# Patient Record
Sex: Female | Born: 1935 | Race: White | Hispanic: No | Smoking: Former smoker
Health system: Southern US, Community
[De-identification: ages and names within clinical notes are randomized; demographics above are authoritative.]

## PROBLEM LIST (undated history)

## (undated) DIAGNOSIS — H409 Unspecified glaucoma: Secondary | ICD-10-CM

## (undated) DIAGNOSIS — Z0000 Encounter for general adult medical examination without abnormal findings: Secondary | ICD-10-CM

## (undated) HISTORY — PX: APPENDECTOMY: SHX54

## (undated) HISTORY — PX: EYE SURGERY: SHX253

---

## 2014-03-03 LAB — POCT URINALYSIS DIPSTICK W/O MICROSCOPE (AUTO)
Bilirubin, UA: NEGATIVE
Blood, UA POC: 25
Glucose, UA POC: NEGATIVE
Ketones, UA: NEGATIVE
Leukocytes, UA: 500
Nitrite, UA: NEGATIVE
Protein, UA POC: NEGATIVE
Spec Grav, UA: 1.015
Urobilinogen, UA: 0.2
pH, UA: 6.5

## 2014-03-03 MED ORDER — NITROFURANTOIN MONOHYD MACRO 100 MG PO CAPS
100 | ORAL_CAPSULE | Freq: Two times a day (BID) | ORAL | Status: AC
Start: 2014-03-03 — End: 2014-03-10

## 2014-03-03 NOTE — Progress Notes (Signed)
Subjective:      Patient ID: Judith RosebushBarbara Liu is a 80 y.o. female.    HPI  Here for possible UTI  Sxs for 3-4 days  + freq  + urg  + dysuria  no hematuria  No vaginal complaints: itch, discharge, bleeding    No abd pain  No back/flank pain  No fever  No chills   No rash  Nl appetite  + h/o recent diarrhea    + h/o UTI - chronic, frequent - treated with Cipro recently for positive urine culture but patient sts she didn't have symptoms at that time - finished antibiotic Thursday and sxs began Sturday  Non-DM      Review of Systems   Constitutional: Negative for fever, chills, diaphoresis and fatigue.   Gastrointestinal: Negative for nausea, vomiting, abdominal pain and diarrhea.   Endocrine:        Non-diabetic   Genitourinary: Positive for dysuria, urgency and frequency. Negative for hematuria, flank pain, vaginal bleeding, vaginal discharge, vaginal pain and pelvic pain.   Musculoskeletal: Negative for back pain.   Skin: Negative for rash.   Neurological: Negative for dizziness.         Objective:   Physical Exam   Constitutional: Vital signs are normal. She appears well-developed and well-nourished.  Non-toxic appearance. She does not appear ill.   Eyes: Conjunctivae are normal. No scleral icterus.   Cardiovascular: Normal rate, regular rhythm and normal heart sounds.    Pulmonary/Chest: Effort normal and breath sounds normal. She has no wheezes. She has no rhonchi. She has no rales.   Abdominal: Soft. Normal appearance and bowel sounds are normal. She exhibits no distension and no mass. There is no hepatosplenomegaly. There is no tenderness. There is no rigidity, no rebound, no guarding and no CVA tenderness.   Genitourinary:   Exam deferred   Neurological: She is alert.   Nursing note and vitals reviewed.    Urine dip: + leuks      Assessment:      1. Urinary tract infection  nitrofurantoin, macrocrystal-monohydrate, (MACROBID) 100 MG capsule   2. Urinary frequency  POCT Urinalysis No Micro (Auto)    Urine Culture             Plan:       Fluids  As per orders         Jamine Wingate T Elizaveta Mattice        Follow up with PCP in 3-5 days. Call for appointment with your current PCP or please set up to establish with new PCP ASAP for appropriate follow up.

## 2014-03-05 LAB — CULTURE, URINE

## 2014-05-11 ENCOUNTER — Ambulatory Visit: Admit: 2014-05-11 | Discharge: 2014-05-11 | Payer: BLUE CROSS/BLUE SHIELD | Attending: Internal Medicine

## 2014-05-11 DIAGNOSIS — Z0000 Encounter for general adult medical examination without abnormal findings: Secondary | ICD-10-CM

## 2014-05-11 MED ORDER — COMPRESSION STOCKINGS
Status: DC
Start: 2014-05-11 — End: 2016-05-16

## 2014-05-11 NOTE — Progress Notes (Signed)
Subjective:      Patient ID: Judith RosebushBarbara Liu is a 80 y.o. female.    HPI     Pt here to establish care.  She tells me her old PCP retired so she needed a new one.  She is overall very healthy.  She only has issues with glaucoma.  She sees Dr. Letta KocherWillet for this.  She takes her eye drops as directed.  She also is on statin for hyperlipidemia.      She does have some lower extremity edema.  She would like an rx for compression stockings.      She does have a history of UTI's especially when traveling.  She is not sure why this occurs.      She does have some issues with sleep.  She tells me she will have to use temazepam for this but uses it as needed.  She also admits to not being able to have a cocktail if she takes it.      Pt already got her flu shot through her pharmacy.      She no longer is having mammograms done.      Review of Systems   Constitutional: Negative for fever, chills, activity change, appetite change, fatigue and unexpected weight change.   HENT: Negative for congestion, postnasal drip, sinus pressure, sore throat and trouble swallowing.    Eyes: Negative for visual disturbance.        Glaucoma    Respiratory: Negative for apnea, cough, chest tightness, shortness of breath, wheezing and stridor.    Cardiovascular: Positive for leg swelling. Negative for chest pain and palpitations.   Gastrointestinal: Negative for nausea, vomiting, abdominal pain, diarrhea, constipation, blood in stool, abdominal distention and rectal pain.   Endocrine: Negative for polydipsia and polyuria.   Genitourinary: Negative for dysuria, urgency, frequency and difficulty urinating.   Musculoskeletal: Negative for myalgias, back pain, arthralgias and neck pain.   Skin: Negative for color change and rash.   Allergic/Immunologic: Negative for environmental allergies and food allergies.   Neurological: Negative for dizziness, tremors, seizures, syncope, weakness, light-headedness and headaches.   Hematological: Negative for  adenopathy. Does not bruise/bleed easily.   Psychiatric/Behavioral: Positive for sleep disturbance. Negative for dysphoric mood and agitation. The patient is not nervous/anxious.        Objective:   Physical Exam   Constitutional: She is oriented to person, place, and time. She appears well-developed and well-nourished.   HENT:   Head: Atraumatic.   Right Ear: External ear normal.   Left Ear: External ear normal.   Nose: Nose normal.   Mouth/Throat: Oropharynx is clear and moist.   Eyes: EOM are normal.   Right eye-pupil is disfigured-doesn't react to light   Neck: Neck supple. No thyromegaly present.   Cardiovascular: Normal rate, regular rhythm, normal heart sounds and intact distal pulses.    No murmur heard.  Pulmonary/Chest: Effort normal and breath sounds normal. No respiratory distress. She has no wheezes. She has no rales. She exhibits no tenderness.   Abdominal: Soft. Bowel sounds are normal. She exhibits no distension and no mass. There is no tenderness. There is no rebound and no guarding.   Musculoskeletal: Normal range of motion. She exhibits no edema or tenderness.   Lymphadenopathy:     She has no cervical adenopathy.   Neurological: She is alert and oriented to person, place, and time.   Skin: Skin is warm and dry.   Psychiatric: She has a normal mood and affect.  Assessment:      1. Routine adult health maintenance  Comprehensive Metabolic Panel    CBC   2. Glaucoma     3. Hyperlipemia  Comprehensive Metabolic Panel    Lipid Panel   4. Frequent UTI  CBC    URINALYSIS-MACROSCOPIC   5. Bilateral edema of lower extremity  TSH without Reflex    Compression Stockings MISC            Plan:      1) Will order labs for patient to get done in next 1-2 weeks  2) Ordered compression stockings for leg swelling  3) F/u with Dr. Letta KocherWillet for her glaucoma  4) F/u in 1 year

## 2014-06-10 ENCOUNTER — Encounter: Admit: 2014-06-10 | Discharge: 2014-06-10 | Payer: BLUE CROSS/BLUE SHIELD

## 2014-06-10 DIAGNOSIS — R600 Localized edema: Secondary | ICD-10-CM

## 2014-06-10 LAB — COMPREHENSIVE METABOLIC PANEL
ALT: 58 U/L (ref 13–61)
AST: 39 U/L — ABNORMAL HIGH (ref 0–31)
Albumin,Serum: 3.8 G/dL (ref 3.4–5.0)
Albumin/Globulin Ratio: 1.1 RATIO (ref 1.0–2.5)
Alkaline Phosphatase: 64 U/L (ref 45–117)
Anion Gap: 10 mmol/L (ref 6–16)
BUN/Creatinine Ratio: 17 RATIO (ref 6.0–20.0)
BUN: 16 mG/dL (ref 7–25)
CO2: 25 mmol/L (ref 21–31)
Calcium: 9.3 mG/dL (ref 8.2–10.5)
Chloride: 107 mmol/L (ref 98–109)
Creatinine: 0.94 mG/dL (ref 0.60–1.50)
GFR African American: >60 mL/min/{1.73_m2}
GFR Non-African American: 56 mL/min/{1.73_m2} — AB
Globulin: 3.4 G/dL (ref 2.4–4.1)
Glucose: 118 mG/dL — ABNORMAL HIGH (ref 70–100)
Potassium: 3.5 mmol/L (ref 3.5–5.0)
Sodium: 142 mmol/L (ref 135–145)
Total Bilirubin: 0.8 mG/dL (ref 0.3–1.0)
Total Protein: 7.2 G/dL (ref 6.4–8.3)

## 2014-06-10 LAB — LIPID PANEL
Cholesterol, Total: 223 mG/dL — ABNORMAL HIGH (ref 100–199)
HDL: 77 mG/dL (ref 40–59)
LDL Calculated: 124 mG/dL (ref 0–129)
Triglycerides: 111 mG/dL (ref 40–149)

## 2014-06-11 LAB — URINALYSIS-MACROSCOPIC
Bilirubin Urine: NEGATIVE
Erythrocytes, Urine: 5 /HPF — ABNORMAL HIGH (ref 0–3)
Glucose, Ur: NEGATIVE
Ketones, Urine: NEGATIVE
LEUKOCYTES, UA: >100 /HPF — ABNORMAL HIGH (ref 0–5)
Nitrite, Urine: NEGATIVE
Protein, UA: NEGATIVE
Specific Gravity, UA: 1.013 (ref 1.005–1.030)
Urobilinogen, Urine: 1 mG/dL (ref 0.2–1.0)
pH, UA: 6 (ref 5.0–8.0)

## 2014-06-17 ENCOUNTER — Ambulatory Visit: Admit: 2014-06-17 | Discharge: 2014-06-17 | Payer: BLUE CROSS/BLUE SHIELD | Attending: Family Medicine

## 2014-06-17 DIAGNOSIS — N3001 Acute cystitis with hematuria: Secondary | ICD-10-CM

## 2014-06-17 LAB — POCT URINALYSIS DIPSTICK W/O MICROSCOPE (AUTO)
Bilirubin, UA: NEGATIVE
Blood, UA POC: 10
Glucose, UA POC: NEGATIVE
Ketones, UA: NEGATIVE
Leukocytes, UA: 500
Nitrite, UA: NEGATIVE
Protein, UA POC: 15
Spec Grav, UA: 1.025
Urobilinogen, UA: 0.2
pH, UA: 6

## 2014-06-17 MED ORDER — NITROFURANTOIN MONOHYD MACRO 100 MG PO CAPS
100 MG | ORAL_CAPSULE | Freq: Two times a day (BID) | ORAL | Status: DC
Start: 2014-06-17 — End: 2014-06-17

## 2014-06-17 MED ORDER — CIPROFLOXACIN HCL 500 MG PO TABS
500 MG | ORAL_TABLET | Freq: Two times a day (BID) | ORAL | Status: AC
Start: 2014-06-17 — End: 2014-06-22

## 2014-06-17 NOTE — Addendum Note (Signed)
Addended byCorinna Lines: Zinia Innocent on: 06/17/2014 12:27 PM     Modules accepted: Orders, Medications

## 2014-06-17 NOTE — Progress Notes (Signed)
macrobid requires prior auth per pharmacy - Rx changed to Cipro, 500 mg BID x 5 days

## 2014-06-17 NOTE — Progress Notes (Signed)
Subjective:      Patient ID: Judith Liu is a 80 y.o. female.    HPI  Here for possible UTI  Sxs for 2 days  + freq  + urg  + dysuria  no hematuria  No vaginal complaints: itch, discharge, bleeding    No abd pain  No n/vtg/diarrhea  No back/flank pain  No fever  No chills   No rash  Nl appetite    + h/o UTI - chronic, frequent  Seen here in August - treated to resolution with Macrobid (Cipro for some reason was not effective)  Non-DM      Review of Systems   Constitutional: Negative for fever, chills, diaphoresis and fatigue.   Gastrointestinal: Negative for nausea, vomiting, abdominal pain and diarrhea.   Endocrine:        Non-diabetic   Genitourinary: Positive for dysuria, urgency and frequency. Negative for hematuria, flank pain, vaginal bleeding, vaginal discharge, vaginal pain and pelvic pain.   Musculoskeletal: Negative for back pain.   Skin: Negative for rash.   Neurological: Negative for dizziness.       Objective:   Physical Exam   Constitutional: Vital signs are normal. She appears well-developed and well-nourished.  Non-toxic appearance. She does not appear ill.   Eyes: Conjunctivae are normal. No scleral icterus.   Cardiovascular: Normal rate, regular rhythm and normal heart sounds.    Pulmonary/Chest: Effort normal and breath sounds normal. She has no wheezes. She has no rhonchi. She has no rales.   Abdominal: Soft. Normal appearance and bowel sounds are normal. She exhibits no distension and no mass. There is no hepatosplenomegaly. There is no tenderness. There is no rigidity, no rebound, no guarding and no CVA tenderness.   Genitourinary:   Exam deferred   Neurological: She is alert.   Nursing note and vitals reviewed.      Urine dip: + tr blood, + leuks        Assessment:      1. Acute cystitis with hematuria  Urine Culture    nitrofurantoin, macrocrystal-monohydrate, (MACROBID) 100 MG capsule   2. Dysuria  POCT Urinalysis No Micro (Auto)            Plan:      As per orders  Fluids  Azo prn          Marchello Rothgeb T Vonzell Lindblad        Follow up with PCP in 3-5 days. Call for appointment with your current PCP or please set up to establish with new PCP ASAP for appropriate follow up.

## 2014-06-19 LAB — CULTURE, URINE

## 2014-08-18 ENCOUNTER — Ambulatory Visit: Admit: 2014-08-18 | Discharge: 2014-08-18 | Payer: BLUE CROSS/BLUE SHIELD | Attending: Family Medicine

## 2014-08-18 DIAGNOSIS — N390 Urinary tract infection, site not specified: Secondary | ICD-10-CM

## 2014-08-18 LAB — POCT URINALYSIS DIPSTICK W/O MICROSCOPE (AUTO)
Bilirubin, UA: NEGATIVE
Blood, UA POC: 80
Glucose, UA POC: NEGATIVE
Ketones, UA: NEGATIVE
Leukocytes, UA: 500
Nitrite, UA: POSITIVE
Protein, UA POC: 30
Spec Grav, UA: 1.02
Urobilinogen, UA: 1
pH, UA: 6.5

## 2014-08-18 MED ORDER — NITROFURANTOIN MONOHYD MACRO 100 MG PO CAPS
100 MG | ORAL_CAPSULE | Freq: Two times a day (BID) | ORAL | Status: AC
Start: 2014-08-18 — End: 2014-08-25

## 2014-08-18 NOTE — Progress Notes (Signed)
[facility]  [facility]  [address]  [place] [address]  Dept: [phone]  Dept Fax: [phone]  Loc: [phone]    Judith Liu is a 81 y.o. female who presents today for her medical conditions/complaints as noted below.  Marykate Heuberger is c/o of Dysuria; and Other      Chief Complaint   Patient presents with   ??? Dysuria     pain started 1 day ago   ??? Other     est         HPI:     Dysuria   This is a new problem. The current episode started in the past 7 days. The problem has been gradually worsening. The pain is mild. Associated symptoms include frequency and urgency. Pertinent negatives include no chills, discharge, hematuria, hesitancy, nausea, sweats or vomiting. She has tried increased fluids for the symptoms. The treatment provided no relief. Her past medical history is significant for recurrent UTIs.   Other  Pertinent negatives include no chills, nausea or vomiting.       Past Medical History   Diagnosis Date   ??? Glaucoma    ??? Hyperlipidemia       Past Surgical History   Procedure Laterality Date   ??? Appendectomy     ??? Hysterectomy         Family History   Problem Relation Age of Onset   ??? Mental Illness Sister      possible bipolar   ??? Asthma Daughter    ??? Asthma Son        History   Substance Use Topics   ??? Smoking status: Former Smoker     Types: Cigarettes   ??? Smokeless tobacco: Never Used   ??? Alcohol Use: 0.6 oz/week     1 Not specified per week      Comment: 1 martini at night       Current Outpatient Prescriptions   Medication Sig Dispense Refill   ??? nitrofurantoin, macrocrystal-monohydrate, (MACROBID) 100 MG capsule Take 1 capsule by mouth 2 times daily for 7 days 14 capsule 0   ??? simvastatin (ZOCOR) 10 MG tablet   1   ??? brimonidine (ALPHAGAN P) 0.15 % ophthalmic solution   12   ??? FLUZONE HIGH-DOSE 0.5 ML SUSY injection   0   ??? diclofenac (VOLTAREN) 0.1 % ophthalmic solution 1 drop 4 times daily     ??? dorzolamide-timolol (COSOPT) 22.3-6.8 MG/ML ophthalmic  solution Use 1 Drop in the right eye twice daily.     ??? brimonidine (ALPHAGAN) 0.2 % ophthalmic solution 1 drop 3 times daily     ??? aspirin 81 MG tablet Take 81 mg by mouth daily     ??? Omega-3 Fatty Acids (OMEGA 3 PO) Take by mouth     ??? Multiple Vitamins-Minerals (CENTRUM SILVER PO) Take by mouth     ??? Compression Stockings MISC by Does not apply route 20/5mm HG, wear during the day and take off at night  Knee high 1 each 0   ??? bimatoprost (LUMIGAN) 0.01 % SOLN ophthalmic drops Place 1 drop into the right eye nightly       No current facility-administered medications for this visit.     Allergies   Allergen Reactions   ??? Sulfa Antibiotics Other (See Comments)       Health Maintenance   Topic Date Due   ??? TETANUS VACCINE ADULT (11 YEARS AND UP)  04/04/1937   ???  ZOSTAVAX VACCINE  04/03/1988   ??? PNEUMO VAC >= 65 (1 of 2 - PCV13) 04/03/1993   ??? DEPRESSION SCREENING  04/03/1993   ??? FALLS RISK  04/03/1993   ??? FLU VACCINE YEARLY (ADULT)  03/01/2015       Subjective:      Review of Systems   Constitutional: Negative for chills.   Gastrointestinal: Negative for nausea and vomiting.   Genitourinary: Positive for dysuria, urgency and frequency. Negative for hesitancy and hematuria.       Objective:     Physical Exam   Constitutional: She appears well-developed and well-nourished.   HENT:   Head: Normocephalic.   Eyes: Pupils are equal, round, and reactive to light.   Neck: Normal range of motion.   Cardiovascular: Normal rate.    No murmur heard.  Pulmonary/Chest: Effort normal. She has no wheezes.   Abdominal: Soft. She exhibits no distension. There is no tenderness. There is no rebound.   Musculoskeletal: She exhibits no tenderness.   Lymphadenopathy:     She has no cervical adenopathy.   Neurological: She is alert.   Skin: Skin is warm. No rash noted. No erythema.     BP 117/77 mmHg   Pulse 97   Temp(Src) 98.3 ??F (36.8 ??C) (Temporal)   Wt 141 lb (63.957 kg)    Assessment:      The primary encounter diagnosis was Urinary  tract infection without hematuria, site unspecified. Diagnoses of Dysuria and Leukocytes in urine were also pertinent to this visit.    Plan:     Orders Placed This Encounter   Medications   ??? nitrofurantoin, macrocrystal-monohydrate, (MACROBID) 100 MG capsule     Sig: Take 1 capsule by mouth 2 times daily for 7 days     Dispense:  14 capsule     Refill:  0       Orders Placed This Encounter   Procedures   ??? Urine Culture     Standing Status: Future      Number of Occurrences: 1      Standing Expiration Date: 08/19/2015     Order Specific Question:  Specify (ex-cath, midstream, cysto, etc)?     Answer:  midstream   ??? POCT Urinalysis No Micro (Auto)     Follow up with PCP in 3-5 days. Call for appointment with your current PCP or please set up to establish with new PCP ASAP for appropriate follow up.  Humidify Air at home, Take medications as directed, Increase fluids and alternate Tylenol and Advil every four hours to help control pain and fever, Instructions reviewed with patient and all questions answered prior to discharge  Call for culture results    Patient given educational materials - see patient instructions.  Discussed use, benefit, and side effects of prescribed medications.  All patient questions answered.  Pt voiced understanding. Patient advised to follow up with primary care physician for all health maintenance concerns and follow up as appropriate.  Instructed to continue current medications, diet and exercise.  Patient agreed with treatment plan. Follow up as directed.     Electronically signed by Juluis Rainierory A Mohit Zirbes, DO on 08/18/2014 at 1:11 PM

## 2014-08-18 NOTE — Patient Instructions (Signed)
Follow up with PCP in 3-5 days. Call for appointment with your current PCP or please set up to establish with new PCP ASAP for appropriate follow up.  Humidify Air at home, Take medications as directed, Increase fluids and alternate Tylenol and Advil every four hours to help control pain and fever, Instructions reviewed with patient and all questions answered prior to discharge

## 2014-08-20 LAB — CULTURE, URINE

## 2015-05-11 ENCOUNTER — Encounter: Attending: Internal Medicine

## 2015-08-03 ENCOUNTER — Ambulatory Visit: Admit: 2015-08-03 | Discharge: 2015-08-03 | Payer: MEDICARE | Attending: Family Medicine

## 2015-08-03 DIAGNOSIS — J069 Acute upper respiratory infection, unspecified: Secondary | ICD-10-CM

## 2015-08-03 MED ORDER — PSEUDOEPH-BROMPHEN-DM 30-2-10 MG/5ML PO SYRP
2-30-10 MG/5ML | Freq: Four times a day (QID) | ORAL | 0 refills | Status: DC | PRN
Start: 2015-08-03 — End: 2016-05-16

## 2015-08-03 NOTE — Progress Notes (Signed)
[facility]  [facility]  [address]  [place] [id number]  Dept: [phone]  Dept Fax: [phone]  Loc: [phone]    Judith RosebushBarbara Liu is a 82 y.o. female who presents today for her medical conditions/complaints as noted below.  Judith Liu is c/o of Pharyngitis (x1 day, drainage) and Other (est)      Chief Complaint   Patient presents with   ??? Pharyngitis     x1 day, drainage   ??? Other     est         HPI:     HPI   Pt. Presented here c/o sore throat this morning when she wakes up; improved as the day progress. No fever or chills. No other complaints.    Past Medical History   Diagnosis Date   ??? Glaucoma    ??? Hyperlipidemia       Past Surgical History   Procedure Laterality Date   ??? Appendectomy     ??? Hysterectomy         Family History   Problem Relation Age of Onset   ??? Mental Illness Sister      possible bipolar   ??? Asthma Daughter    ??? Asthma Son        Social History   Substance Use Topics   ??? Smoking status: Former Smoker     Types: Cigarettes   ??? Smokeless tobacco: Never Used   ??? Alcohol use 0.6 oz/week     1 Standard drinks or equivalent per week      Comment: 1 martini at night       Current Outpatient Prescriptions   Medication Sig Dispense Refill   ??? brimonidine (ALPHAGAN P) 0.15 % ophthalmic solution   12   ??? FLUZONE HIGH-DOSE 0.5 ML SUSY injection   0   ??? diclofenac (VOLTAREN) 0.1 % ophthalmic solution 1 drop 4 times daily     ??? dorzolamide-timolol (COSOPT) 22.3-6.8 MG/ML ophthalmic solution Use 1 Drop in the right eye twice daily.     ??? brimonidine (ALPHAGAN) 0.2 % ophthalmic solution 1 drop 3 times daily     ??? aspirin 81 MG tablet Take 81 mg by mouth daily     ??? Omega-3 Fatty Acids (OMEGA 3 PO) Take by mouth     ??? Multiple Vitamins-Minerals (CENTRUM SILVER PO) Take by mouth     ??? Compression Stockings MISC by Does not apply route 20/7030mm HG, wear during the day and take off at night  Knee high 1 each 0   ??? bimatoprost (LUMIGAN) 0.01 % SOLN ophthalmic drops Place  1 drop into the right eye nightly       No current facility-administered medications for this visit.      Allergies   Allergen Reactions   ??? Sulfa Antibiotics Other (See Comments)       Health Maintenance   Topic Date Due   ??? DTaP/Tdap/Td vaccine (1 - Tdap) 04/04/1945   ??? Zostavax vaccine  04/03/1988   ??? Pneumococcal low/med risk (1 of 2 - PCV13) 04/03/1993   ??? Flu vaccine (1) 03/01/2015       Subjective:      Review of Systems   HENT: Positive for sore throat.    All other systems reviewed and are negative.      Objective:     Physical Exam   Constitutional: She appears well-developed.   HENT:   Head: Normocephalic and atraumatic.  Right Ear: External ear normal.   Left Ear: External ear normal.   Nose: Nose normal.   Mouth/Throat: Oropharynx is clear and moist.   Eyes: Pupils are equal, round, and reactive to light.   Neck: Normal range of motion.   Cardiovascular: Normal rate.    Pulmonary/Chest: Effort normal.   Abdominal: Soft.     Visit Vitals   ??? BP (!) 158/69   ??? Pulse 76   ??? Temp 97.9 ??F (36.6 ??C) (Temporal)   ??? Wt 137 lb (62.1 kg)   ??? BMI 23.52 kg/m2       Assessment:      Viral URI    Plan:     Patient given educational materials - see patient instructions.  Discussed use, benefit, and side effects of prescribed medications.  All patient questions answered.  Pt voiced understanding. Patient advised to follow up with primary care physician for all health maintenance concerns and follow up as appropriate.  Instructed to continue current medications, diet and exercise.  Patient agreed with treatment plan. Follow up as directed.     Electronically signed by Floydene Flock, MD on 08/03/2015 at 12:32 PM

## 2016-05-16 ENCOUNTER — Encounter

## 2016-05-16 ENCOUNTER — Ambulatory Visit: Admit: 2016-05-16 | Discharge: 2016-05-16 | Payer: MEDICARE | Attending: Family Medicine

## 2016-05-16 DIAGNOSIS — Z0000 Encounter for general adult medical examination without abnormal findings: Secondary | ICD-10-CM

## 2016-05-16 LAB — CBC WITH AUTO DIFFERENTIAL
Absolute Baso #: 0.1 10*3/uL (ref 0.0–0.2)
Absolute Eos #: 0.1 10*3/uL (ref 0.0–0.5)
Absolute Lymph #: 2.5 10*3/uL (ref 1.0–4.3)
Absolute Mono #: 0.8 10*3/uL (ref 0.0–0.8)
Absolute Neut #: 5 10*3/uL (ref 1.8–7.0)
Basophils: 0.8 %
Eosinophils: 1.5 %
Granulocytes %: 58.9 %
Hematocrit: 46 % (ref 35.0–47.0)
Hemoglobin: 15.4 g/dL (ref 11.7–16.0)
Lymphocyte %: 29.1 %
MCH: 34.1 pg — ABNORMAL HIGH (ref 26.0–34.0)
MCHC: 33.6 % (ref 32.0–36.0)
MCV: 101.5 fL — ABNORMAL HIGH (ref 79.0–98.0)
MPV: 8.9 fL (ref 7.4–10.4)
Monocytes: 9.7 %
Platelets: 204 10*3/uL (ref 140–440)
RBC: 4.53 10*6/uL (ref 3.80–5.20)
RDW: 14.1 % (ref 11.5–14.5)
WBC: 8.4 10*3/uL (ref 3.6–10.7)

## 2016-05-16 NOTE — Progress Notes (Signed)
Subjective:     Patient: Judith RosebushBarbara Liu is a 82 y.o. female     HPI Pt presents to the office to establish care. States that she has no compliants. States that her children are trying to transition her to a nursing home, she is not so sure that she would like to do this, likes being independent. States that she has had a mammogram in the past year, states that she does not remember where it was performed. States that she has already had flu shot. Has glaucoma that she sees Dr Beckey DowningWillett. Denies any other medications and is not interested in labs at this time.  Okay with pneumonia vaccination    Review of Systems   Constitutional: Negative for activity change, appetite change, chills, diaphoresis, fatigue, fever and unexpected weight change.   HENT: Negative for congestion, ear pain, postnasal drip, rhinorrhea and sore throat.    Eyes: Negative for pain and visual disturbance.   Respiratory: Negative for cough, shortness of breath, wheezing and stridor.    Cardiovascular: Negative for chest pain, palpitations and leg swelling.   Gastrointestinal: Negative for abdominal pain, blood in stool, constipation, diarrhea, nausea and vomiting.   Endocrine: Negative for polydipsia, polyphagia and polyuria.   Genitourinary: Negative for dysuria, frequency, hematuria, menstrual problem, urgency and vaginal pain.   Musculoskeletal: Negative for arthralgias, back pain and myalgias.   Skin: Negative for color change and rash.   Neurological: Negative for dizziness, weakness, numbness and headaches.   Hematological: Does not bruise/bleed easily.   Psychiatric/Behavioral: Negative for dysphoric mood. The patient is not nervous/anxious.         Allergies   Allergen Reactions   ??? Sulfa Antibiotics Other (See Comments)     Current Outpatient Prescriptions on File Prior to Visit   Medication Sig Dispense Refill   ??? dorzolamide-timolol (COSOPT) 22.3-6.8 MG/ML ophthalmic solution Use 1 Drop in the right eye twice daily.     ??? brimonidine  (ALPHAGAN) 0.2 % ophthalmic solution 1 drop 3 times daily     ??? aspirin 81 MG tablet Take 81 mg by mouth daily     ??? Omega-3 Fatty Acids (OMEGA 3 PO) Take by mouth     ??? Multiple Vitamins-Minerals (CENTRUM SILVER PO) Take by mouth     ??? bimatoprost (LUMIGAN) 0.01 % SOLN ophthalmic drops Place 1 drop into the right eye nightly       No current facility-administered medications on file prior to visit.       Past Medical History:   Diagnosis Date   ??? Glaucoma    ??? Hyperlipidemia       Past Surgical History:   Procedure Laterality Date   ??? APPENDECTOMY     ??? HYSTERECTOMY       Family History   Problem Relation Age of Onset   ??? Mental Illness Sister      possible bipolar   ??? Asthma Daughter    ??? Asthma Son      Social History   Substance Use Topics   ??? Smoking status: Former Smoker     Types: Cigarettes   ??? Smokeless tobacco: Never Used   ??? Alcohol use 0.6 oz/week     1 Standard drinks or equivalent per week      Comment: 1 martini at night           Objective:     BP 110/64    Pulse 115    Temp 98.2 ??F (36.8 ??C)  Ht 5\' 4"  (1.626 m)    Wt 123 lb 9.6 oz (56.1 kg)    SpO2 96%    BMI 21.22 kg/m??     Physical Exam   Constitutional: She is oriented to person, place, and time. She appears well-developed and well-nourished.   HENT:   Head: Normocephalic.   Right Ear: Tympanic membrane and ear canal normal.   Left Ear: Tympanic membrane and ear canal normal.   Mouth/Throat: Oropharynx is clear and moist and mucous membranes are normal.   Eyes: Conjunctivae and EOM are normal. Pupils are equal, round, and reactive to light.   Neck: Normal range of motion. Neck supple. No thyromegaly present.   Cardiovascular: Normal rate, regular rhythm and normal heart sounds.    Pulmonary/Chest: Effort normal and breath sounds normal. No respiratory distress. She has no wheezes. She has no rales.   Abdominal: Soft. Bowel sounds are normal. She exhibits no distension and no mass. There is no tenderness. There is no rebound and no guarding.    Musculoskeletal: Normal range of motion. She exhibits no edema or tenderness.   Lymphadenopathy:     She has no cervical adenopathy.   Neurological: She is alert and oriented to person, place, and time. She has normal reflexes. No cranial nerve deficit.   Skin: Skin is warm and dry. No rash noted.   Psychiatric: She has a normal mood and affect. Judgment normal.   Nursing note and vitals reviewed.      Assessment      1. Annual physical exam         Plan      1. Annual physical exam  - Comprehensive Metabolic Panel; Future  - TSH without Reflex; Future  - Lipid Panel; Future  - CBC Auto Differential; Future          Robinette Haines, MD  05/16/16  4:11 PM      Health Maintenance Due   Topic Date Due   ??? DTaP/Tdap/Td vaccine (1 - Tdap) 04/04/1945   ??? Zostavax vaccine  04/03/1988   ??? Pneumococcal low/med risk (1 of 2 - PCV13) 04/03/1993   ??? Flu vaccine (1) 03/31/2016

## 2016-05-16 NOTE — Patient Instructions (Addendum)
Patient Education          pneumococcal 13-valent conjugate vaccine  Pronunciation:  NOO moe KOK al 13-VAY lent KON joo gate VAX een  Brand:  Prevnar 13  What is the most important information I should know about this vaccine?  For children, the pneumococcal 13-valent vaccine is given in a series of shots. The first shot is usually given when the child is 2 months old. The booster shots are then given at 4 months, 6 months, and 12 to 15 months of age. Adults usually receive only one dose of the vaccine.  In a child older than 6 months who has not yet received this vaccine, the first dose can be given any time from the age of 7 months through 5 years (before the 6th birthday).  If the child is less than 1 year old at the time of the first shot, he or she will need 2 booster doses. If the child is 12 to 23 months old at the time of the first shot, he or she will need 1 booster dose. A child who is 2 years or older at the time of the first shot may need only the one shot and no booster doses.  The timing of this vaccination is very important for it to be effective. Your child's individual booster schedule may be different from these guidelines. Follow your doctor's instructions or the schedule recommended by the health department of the state you live in.  Keep track of any and all side effects your child has after receiving this vaccine. When the child receives a booster dose, you will need to tell the doctor if the previous shot caused any side effects.  You can still receive a vaccine if you have a minor cold. In the case of a more severe illness with a fever or any type of infection, wait until you get better before receiving this vaccine.  Becoming infected with pneumococcal disease (such as pneumonia or meningitis) is much more dangerous to your health than receiving this vaccine. However, like any medicine, this vaccine can cause side effects but the risk of serious side effects is extremely low.  Be sure to  keep your child on a regular schedule for other immunizations against diseases such as diphtheria, tetanus, pertussis (whooping cough), measles, mumps, hepatitis, or varicella (chicken pox). Your doctor or state health department can provide you with a recommended immunization schedule.  What is pneumococcal 13-valent conjugate vaccine?  Pneumococcal disease is a serious infection caused by a bacteria. Pneumococcal bacteria can infect the sinuses and inner ear. It can also infect the lungs, blood, and brain, and these conditions can be fatal.  Pneumococcal 13-valent vaccine is used to prevent infection caused by pneumococcal bacteria. This vaccine contains 13 different types of pneumococcal bacteria.  Pneumococcal 13-valent vaccine works by exposing you to a small amount of the bacteria or a protein from the bacteria, which causes the body to develop immunity to the disease. This vaccine will not treat an active infection that has already developed in the body.  Pneumococcal 13-valent vaccine is for use in children from 6 weeks to 5 years old, and in adults who are 50 and older.  Becoming infected with pneumococcal disease (such as pneumonia or meningitis) is much more dangerous to your health than receiving this vaccine. However, like any medicine, this vaccine can cause side effects but the risk of serious side effects is extremely low.  Like any vaccine, pneumococcal 13-valent vaccine   may not provide protection from disease in every person.  What should I discuss with my healthcare provider before receiving this vaccine?  Keep track of any and all side effects your child has after receiving this vaccine. When the child receives a booster dose, you will need to tell the doctor if the previous shot caused any side effects.  You should not receive this vaccine if you ever had a severe allergic reaction to a pneumococcal or diphtheria vaccine.  Before your child receives this vaccine, tell your doctor if the child  was born prematurely.  To make sure you or your child can safely receive this vaccine, tell your doctor if you or your child have any of these other conditions:  ?? a bleeding or blood clotting disorder such as hemophilia or easy bruising; or  ?? a weak immune system caused by disease, bone marrow transplant, or by using certain medicines or receiving cancer treatments.  You can still receive a vaccine if you have a minor cold. In the case of a more severe illness with a fever or any type of infection, wait until you get better before receiving this vaccine.  How is this vaccine given?  This vaccine is injected into a muscle. You will receive this injection in a doctor's office or clinic setting.  For children, the pneumococcal 13-valent vaccine is given in a series of shots. The first shot is usually given when the child is 2 months old. The booster shots are then given at 4 months, 6 months, and 12 to 15 months of age. Adults usually receive only one dose of the vaccine.  The first injection should be given no earlier than 6 weeks of age. Allow at least 2 months to pass between injections.  If your child is older than 6 months, he or she can still receive this vaccine on the following schedule:  ?? Age 7-11 months: two injections at least 4 weeks apart, followed by a third injection after the child turns 1 year (at least 2 months after the second injection);  ?? Age 12-23 months: two injections at least 2 months apart;  ?? Age 24 months to 5 years (before the 6th birthday): one injection.  The timing of this vaccination is very important for it to be effective. Your child's individual booster schedule may be different from these guidelines. Follow your doctor's instructions or the schedule recommended by the health department of the state you live in.  Your doctor may recommend treating fever and pain with an aspirin-free pain reliever such as acetaminophen (Tylenol) or ibuprofen (Motrin, Advil, and others) when the  shot is given and for the next 24 hours. Follow the label directions or your doctor's instructions about how much of this medicine to give your child.  It is especially important to prevent fever from occurring in a child who has a seizure disorder such as epilepsy.  Be sure to keep your child on a regular schedule for other immunizations such as diphtheria, tetanus, pertussis (whooping cough), hepatitis, and varicella (chicken pox). Your doctor or state health department can provide you with a recommended immunization schedule.  What happens if I miss a dose?  Contact your doctor if your child will miss a booster dose or gets behind schedule. The next dose should be given as soon as possible. There is no need to start over.  Be sure your child receives all recommended doses of this vaccine. If your child does not receive the full series   of vaccines, he or she may not be fully protected against the disease.  What happens if I overdose?  An overdose of this vaccine is unlikely to occur.  What should I avoid before or after receiving this vaccine?  Follow your doctor's instructions about any restrictions on food, beverages, or activity.  What are the possible side effects of this vaccine?  Your child should not receive a booster vaccine if he or she had a life-threatening allergic reaction after the first shot.  Keep track of any and all side effects your child has after receiving this vaccine. When the child receives a booster dose, you will need to tell the doctor if the previous shot caused any side effects.  Get emergency medical help if your child has any of these signs of an allergic reaction: hives; difficulty breathing; swelling of the face, lips, tongue, or throat.  Call your doctor at once if you or your child has a serious side effect such as:  ?? high fever (103 degrees or higher);  ?? seizure (convulsions);  ?? wheezing, trouble breathing;  ?? severe stomach pain, severe vomiting or diarrhea;  ?? easy bruising  or bleeding; or  ?? severe pain, itching, irritation, or skin changes where the shot was given.  Less serious side effects include  ?? crying, fussiness;  ?? headache, tired feeling;  ?? muscle or joint pain;  ?? drowsiness, sleeping more or less than usual;  ?? mild redness, swelling, tenderness, or a hard lump where the shot was given;  ?? loss of appetite, mild vomiting or diarrhea;  ?? low fever (102 degrees or less), chills; or  ?? mild skin rash.  This is not a complete list of side effects and others may occur. Call your doctor for medical advice about side effects. You may report vaccine side effects to the [place] Department of Health and Human Services at 1-800-822-7967.  What other drugs will affect this vaccine?  Before receiving this vaccine, tell the doctor about all other vaccines you or your child have recently received.  Also tell the doctor if you or your child have recently received drugs or treatments that can weaken the immune system, including:  ?? an oral, nasal, inhaled, or injectable steroid medicine;  ?? chemotherapy or radiation;  ?? medications to treat psoriasis, rheumatoid arthritis, or other autoimmune disorders, such as azathioprine (Imuran), etanercept (Enbrel), leflunomide (Arava), and others; or  ?? medicines to treat or prevent organ transplant rejection, such as basiliximab (Simulect), cyclosporine (Sandimmune, Neoral, Gengraf), muromonab CD3 (Orthoclone), mycophenolate mofetil (CellCept), sirolimus (Rapamune), or tacrolimus (Prograf).  If you are using any of these medications, you may not be able to receive the vaccine, or may need to wait until the other treatments are finished.  There may be other drugs that can interact with pneumococcal 13-valent vaccine. Tell your doctor about all medications you use. This includes prescription, over-the-counter, vitamin, and herbal products. Do not start a new medication without telling your doctor.  Where can I get more information?  Your doctor or  pharmacist can provide more information about this vaccine. Additional information is available from your local health department or the Centers for Disease Control and Prevention.    Remember, keep this and all other medicines out of the reach of children, never share your medicines with others, and use this medication only for the indication prescribed.  Every effort has been made to ensure that the information provided by [employer] ('[employer]') is accurate, up-to-date, and   complete, but no guarantee is made to that effect. Drug information contained herein may be time sensitive. [employer] information has been compiled for use by healthcare practitioners and consumers in the [place] and therefore [employer] does not warrant that uses outside of the [place] are appropriate, unless specifically indicated otherwise. [employer]'s drug information does not endorse drugs, diagnose patients or recommend therapy. [employer]'s drug information is an informational resource designed to assist licensed healthcare practitioners in caring for their patients and/or to serve consumers viewing this service as a supplement to, and not a substitute for, the expertise, skill, knowledge and judgment of healthcare practitioners. The absence of a warning for a given drug or drug combination in no way should be construed to indicate that the drug or drug combination is safe, effective or appropriate for any given patient. Mult[employer] does not assume any responsibility for any aspect of healthcare administered with the aid of information [employer] provides. The information contained herein is not intended to cover all possible uses, directions, precautions, warnings, drug interactions, allergic reactions, or adverse effects. If you have questions about the drugs you are taking, check with your doctor, nurse or pharmacist.  Copyright 1996-2017 [employer] Version: 4.01. Revision date: 08/15/2010.  Care instructions adapted under  license by [facility]. If you have questions about a medical condition or this instruction, always ask your healthcare professional. [employer] disclaims any warranty or liability for your use of this information.

## 2016-05-17 LAB — COMPREHENSIVE METABOLIC PANEL
ALT: 39 U/L (ref 12–78)
AST: 42 U/L — ABNORMAL HIGH (ref 15–37)
Albumin,Serum: 3.7 g/dL (ref 3.4–5.0)
Alkaline Phosphatase: 131 U/L — ABNORMAL HIGH (ref 45–117)
Anion Gap: 10 NA
BUN: 11 mg/dL (ref 7–25)
CO2: 26 mmol/L (ref 21–32)
Calcium: 9.4 mg/dL (ref 8.2–10.1)
Chloride: 98 mmol/L (ref 98–109)
Creatinine: 0.79 mg/dL (ref 0.55–1.40)
EGFR IF NonAfrican American: >60 mL/min (ref >60)
Glucose: 126 mg/dL — ABNORMAL HIGH (ref 70–100)
Potassium: 3.5 mmol/L (ref 3.5–5.1)
Sodium: 134 mmol/L — ABNORMAL LOW (ref 135–145)
Total Bilirubin: 1 mg/dL (ref 0.2–1.0)
Total Protein: 7.8 g/dL (ref 6.4–8.2)
eGFR African American: >60 mL/min (ref >60)

## 2016-05-17 LAB — LIPID PANEL
Chol/HDL Ratio: 3 NA
Cholesterol: 250 mg/dL — AB (ref <200)
HDL: 79 mg/dL — ABNORMAL HIGH (ref 40–59)
LDL Cholesterol: 134 mg/dL — AB (ref <100)
Triglycerides: 187 mg/dL — AB (ref <150)

## 2016-05-17 LAB — TSH: TSH: 1.95 uU/mL (ref 0.358–3.740)

## 2016-05-24 NOTE — Telephone Encounter (Signed)
Judith MaxwellMary S from [phone] said she is waiting on H & P and completed admission paperwork. Once complete please fax to [phone]

## 2016-05-26 NOTE — Telephone Encounter (Signed)
lmtcb for Judith MaxwellMary Liu from [facility] [phone]

## 2016-05-26 NOTE — Telephone Encounter (Signed)
1.) Britta MccreedyBarbara was hesitant about admission until [facility]  2.) do they have [facility]?

## 2016-05-26 NOTE — Telephone Encounter (Signed)
Spoke to NoconaMary who states they are a assisted living facility. I advised Corrie DandyMary that pt doesn't want to go. Corrie DandyMary said there is memory care or transitional care they are not sure which she may need because the nurse hasn't done assessment on pt yet. Corrie DandyMary is sending over a form to be completed and send H&P over.

## 2016-06-14 NOTE — Telephone Encounter (Signed)
Pt called verified her by DOB she states that she wants you to complete forms and send to [facility].    Please complete forms

## 2016-06-14 NOTE — Telephone Encounter (Signed)
Pts daughter called in requesting status of paperwork for [facility]. I explained to patient that we do not have her on hip[employer] forms that her mother or son listed on hipaa forms will have to call prior to discussing this issue. pts son called back, Jarold SongMichael Petitjean, that last visit to Dr. Robbie LouisPavelko pt [facility]. Unless patient has changed her mind, Dr. Robbie LouisPavelko will not be signing any papers regarding this issue. Casimiro NeedleMichael notified.

## 2016-06-16 NOTE — Telephone Encounter (Signed)
[address] from [facility] [phone] left her detailed message to re-fax us the forms needed to complete. Left her our office phone and fax number if any problems or concerns to contact us

## 2016-06-29 ENCOUNTER — Encounter: Payer: MEDICARE | Attending: Geriatric Medicine

## 2016-06-29 DIAGNOSIS — F5101 Primary insomnia: Secondary | ICD-10-CM

## 2016-06-29 NOTE — Progress Notes (Signed)
AL Follow-up Visit  [facility]    Glory RosebushBarbara Hilgert  DOB: 07/17/1926    Visit Date: 06/29/16  [facility]      CC: Establish care    HPI:  The patient seen for AL Follow-up for establish care.  Moved to [facility]   Limited medical problems: glaucoma, follows with Dr. Letta KocherWillet  Previously living in [place], had been w/ children prior.  Progressive memory loss, poor judgement. H/o EtOH use reported  MMSE by facility staff 25/30  Reports difficulty falling and staying asleep - lifelong problem    History Reviewed:     Past Medical History:   Diagnosis Date   ??? Glaucoma    ??? Hyperlipidemia        Allergies   Allergen Reactions   ??? Sulfa Antibiotics Other (See Comments)     rash       Current Outpatient Prescriptions   Medication Sig Dispense Refill   ??? dorzolamide-timolol (COSOPT) 22.3-6.8 MG/ML ophthalmic solution Use 1 Drop in the right eye twice daily.     ??? brimonidine (ALPHAGAN) 0.2 % ophthalmic solution 1 drop 3 times daily     ??? aspirin 81 MG tablet Take 81 mg by mouth daily     ??? Omega-3 Fatty Acids (OMEGA 3 PO) Take by mouth     ??? Multiple Vitamins-Minerals (CENTRUM SILVER PO) Take by mouth     ??? bimatoprost (LUMIGAN) 0.01 % SOLN ophthalmic drops Place 1 drop into the right eye nightly       No current facility-administered medications for this visit.        Review of Systems   Constitutional: Negative for activity change, appetite change, chills, fatigue and fever.   HENT: Negative for rhinorrhea and trouble swallowing.    Eyes: Negative for visual disturbance.   Respiratory: Negative for cough and shortness of breath.    Cardiovascular: Negative for chest pain and leg swelling.   Gastrointestinal: Negative for abdominal pain, constipation, diarrhea and nausea.   Genitourinary: Negative for dysuria and frequency.   Musculoskeletal: Negative for arthralgias and myalgias.   Skin: Negative for rash.   Neurological: Negative for dizziness, tremors, weakness  and light-headedness.   Psychiatric/Behavioral: Positive for sleep disturbance. Negative for confusion and dysphoric mood.       BP 132/84    Pulse 77    Temp 97.8 ??F (36.6 ??C)    Resp 21    SpO2 98%     Physical Exam   Constitutional: She appears well-developed and well-nourished.   Appropriately dressed and groomed elderly female   HENT:   Head: Normocephalic and atraumatic.   Right Ear: External ear normal.   Left Ear: External ear normal.   Mouth/Throat: Oropharynx is clear and moist.   Eyes: Conjunctivae and EOM are normal. Pupils are equal, round, and reactive to light.   Neck: Normal range of motion. No thyromegaly present.   Cardiovascular: Normal rate, regular rhythm and intact distal pulses.    Murmur heard.  Pulmonary/Chest: Effort normal and breath sounds normal. No respiratory distress. She has no wheezes.   Abdominal: Soft. Bowel sounds are normal. She exhibits no distension. There is no tenderness.   Musculoskeletal: She exhibits edema (trace b/l ankles).   Lymphadenopathy:     She has no cervical adenopathy.   Neurological: She is alert.   Skin: Skin is warm and dry.   Dry flaking skin b/l lower legs   Psychiatric:  She has a normal mood and affect. Her behavior is normal.   Repetitive, poor insight       Recent Labs:    Lab Results   Component Value Date    WBC 8.4 05/16/2016    HGB 15.4 05/16/2016    HCT 46.0 05/16/2016    MCV 101.5 (H) 05/16/2016    PLT 204 05/16/2016    LYMPHOPCT 29.1 05/16/2016    GRANULOCYTES 58.9 05/16/2016    RBC 4.53 05/16/2016    MCH 34.1 (H) 05/16/2016    MCHC 33.6 05/16/2016    RDW 14.1 05/16/2016       Lab Results   Component Value Date    NA 134 (L) 05/16/2016    K 3.5 05/16/2016    CL 98 05/16/2016    CO2 26 05/16/2016    BUN 11 05/16/2016    CREATININE 0.79 05/16/2016    GLUCOSE 126 (H) 05/16/2016    CALCIUM 9.4 05/16/2016    PROT 7.8 05/16/2016    LABALBU 3.7 05/16/2016    BILITOT 1.0 05/16/2016    ALKPHOS 131 (H) 05/16/2016    AST 42 (H) 05/16/2016    ALT 39  05/16/2016    LABGLOM 56 (A) 06/10/2014    GFRAA >60 06/10/2014    AGRATIO 1.1 06/10/2014    GLOB 3.4 06/10/2014       Lab Results   Component Value Date    TSH 1.950 05/16/2016       No results found for: LABA1C    No results found for: INR, PROTIME      Assessment and Plan:    1. Primary insomnia     2. Xerosis cutis     3. Late onset Alzheimer's disease without behavioral disturbance          Melatonin 3mg  QHS dx: insomnia  Lac hydrin to bilateral lower legs BID x 7 days dx: xerosis  Dementia of possible Alzheimer's type given history review  Called daughter for collateral information. Left message on voicemail.    More than 50% of this 45  minute encounter (Time in: 8am , Time out 8:45am ) was spent in discussion of goals and coordination of care.      Discontinued Medications    No medications on file           Electronically signed by Ethelda ChickJessica Sissy Goetzke, DO on 06/29/2016 at 9:46 AM

## 2016-07-04 NOTE — Telephone Encounter (Signed)
Recommend consulting Dr. Judy PimpleMinor, psychology, for alcohol use, anxiety.

## 2016-07-04 NOTE — Telephone Encounter (Signed)
Spoke with nursing about behaviors  Nursing reporting that patient trying to leave the building to walk to grocery store to obtain alcohol. which is over 4 miles away.  She becomes anxious and agitated with attempts at redirection.  Nursing notified patient's daughter.  Concern for safety with this type of behavior, exhibits poor judgement and insight.  Nursing to continue to work with patient and family for safety.

## 2016-07-13 ENCOUNTER — Encounter: Payer: MEDICARE | Attending: Geriatric Medicine

## 2016-07-13 DIAGNOSIS — F0280 Dementia in other diseases classified elsewhere without behavioral disturbance: Secondary | ICD-10-CM

## 2016-07-13 NOTE — Progress Notes (Signed)
Assisted Living Follow-up Visit  [facility]    Glory RosebushBarbara Clarida  DOB: 09/15/1925    Visit Date: 07/13/16  Facility:  [facility]      CC: wandering behaviors    HPI:    Ms Chipper HerbBotelho is seen today for follow up after multiple attempts to leave the building. Due to this, she was transitioned to memory support unit to prevent unsafe behaviors.  Dr Bennett ScrapeMiner consulted for psychology, evaluation pending.  Known h/o alcohol use. Patient reports she was walking to the store to buy gin.  No known injuries while out of building.    History Reviewed:     Past Medical History:   Diagnosis Date   ??? Glaucoma    ??? Hyperlipidemia        Allergies   Allergen Reactions   ??? Sulfa Antibiotics Other (See Comments)     rash       Current Outpatient Prescriptions   Medication Sig Dispense Refill   ??? dorzolamide-timolol (COSOPT) 22.3-6.8 MG/ML ophthalmic solution Use 1 Drop in the right eye twice daily.     ??? brimonidine (ALPHAGAN) 0.2 % ophthalmic solution 1 drop 3 times daily     ??? aspirin 81 MG tablet Take 81 mg by mouth daily     ??? Omega-3 Fatty Acids (OMEGA 3 PO) Take by mouth     ??? Multiple Vitamins-Minerals (CENTRUM SILVER PO) Take by mouth     ??? bimatoprost (LUMIGAN) 0.01 % SOLN ophthalmic drops Place 1 drop into the right eye nightly       No current facility-administered medications for this visit.        Review of Systems   Constitutional: Negative for activity change, appetite change, chills, fatigue and fever.   HENT: Negative for rhinorrhea and trouble swallowing.    Eyes: Negative for visual disturbance.   Respiratory: Negative for cough and shortness of breath.    Cardiovascular: Negative for chest pain and leg swelling.   Gastrointestinal: Negative for abdominal pain, constipation, diarrhea and nausea.   Genitourinary: Negative for dysuria and frequency.   Musculoskeletal: Negative for arthralgias and myalgias.   Skin: Negative for rash.   Neurological: Negative for dizziness, tremors,  weakness and light-headedness.   Psychiatric/Behavioral: Positive for confusion and sleep disturbance ( chronic, unchanged). Negative for dysphoric mood.           Physical Exam   Constitutional: She appears well-developed and well-nourished.   Appropriately dressed and groomed elderly female; napping in AL room; easily awakens to loud voice   HENT:   Head: Normocephalic and atraumatic.   Right Ear: External ear normal.   Left Ear: External ear normal.   Mouth/Throat: Oropharynx is clear and moist.   Hard of hearing   Eyes: Conjunctivae and EOM are normal. Pupils are equal, round, and reactive to light.   Neck: Normal range of motion.   Cardiovascular: Normal rate, regular rhythm and intact distal pulses.    Murmur heard.  Pulmonary/Chest: Effort normal and breath sounds normal. No respiratory distress. She has no wheezes.   Abdominal: Soft. Bowel sounds are normal. She exhibits no distension. There is no tenderness.   Musculoskeletal: She exhibits edema (trace b/l ankles).   Lymphadenopathy:     She has no cervical adenopathy.   Neurological: She is alert.   Oriented to person, place   Skin: Skin is warm and dry.   Dry flaking skin b/l lower legs   Psychiatric: She has a  normal mood and affect. Her behavior is normal.   Repetitive, poor insight, she does recall event of walking to store and endorses it was for the purpose of purchasing alcohol, she reports "I can't do that anymore because it could be dangerous." Does not believe she could get lost or injured.     Nursing note and vitals reviewed.      Recent Labs:    Lab Results   Component Value Date    WBC 8.4 05/16/2016    HGB 15.4 05/16/2016    HCT 46.0 05/16/2016    MCV 101.5 (H) 05/16/2016    PLT 204 05/16/2016    LYMPHOPCT 29.1 05/16/2016    GRANULOCYTES 58.9 05/16/2016    RBC 4.53 05/16/2016    MCH 34.1 (H) 05/16/2016    MCHC 33.6 05/16/2016    RDW 14.1 05/16/2016       Lab Results   Component Value Date    NA 134 (L) 05/16/2016    K 3.5 05/16/2016    CL 98  05/16/2016    CO2 26 05/16/2016    BUN 11 05/16/2016    CREATININE 0.79 05/16/2016    GLUCOSE 126 (H) 05/16/2016    CALCIUM 9.4 05/16/2016    PROT 7.8 05/16/2016    LABALBU 3.7 05/16/2016    BILITOT 1.0 05/16/2016    ALKPHOS 131 (H) 05/16/2016    AST 42 (H) 05/16/2016    ALT 39 05/16/2016    LABGLOM 56 (A) 06/10/2014    GFRAA >60 06/10/2014    AGRATIO 1.1 06/10/2014    GLOB 3.4 06/10/2014       Lab Results   Component Value Date    TSH 1.950 05/16/2016       No results found for: LABA1C    No results found for: INR, PROTIME      Assessment and Plan:    1. Late onset Alzheimer's disease without behavioral disturbance        Proceed with psychology evaluation to investigate ways to encourage socialization. Would benefit from activities off of memory support floor - with supervision.  No subsequent wandering behaviors, but remains at high risk due to poor judgement and insight.  Discontinue Multivitamin, Omega-3, melatonin - resident not taking.    Discontinued Medications    No medications on file           Electronically signed by Ethelda ChickJessica Isaiahs Chancy, DO on 07/14/2016 at 12:46 PM

## 2016-09-02 LAB — URINALYSIS
Bilirubin, Urine: NEGATIVE
Epithelial Cells, Wet Prep: NEGATIVE
Glucose, Ur: NEGATIVE
Ketones, Urine: NEGATIVE
Nitrite, Urine: POSITIVE — AB
Protein, UA: NEGATIVE
Specific Gravity, Urine: 1.011 (ref 1.001–1.030)
Urobilinogen, Urine: NEGATIVE
WBC, UA: >50 /HPF — AB (ref <6)
pH, Urine: 5 (ref 5.0–8.5)

## 2016-09-04 NOTE — Telephone Encounter (Signed)
[facility] [phone], ext. [phone]    UA results - e-coli >100,000

## 2016-09-04 NOTE — Telephone Encounter (Signed)
UA obtained due to dysuria, continues to have urinary symptoms  Sensitivities and allergies reviewed by phone with nurse.  Start Augmentin 500/125mg  BID x 7 days

## 2016-09-07 ENCOUNTER — Encounter: Payer: MEDICARE | Attending: Geriatric Medicine

## 2016-09-07 DIAGNOSIS — G301 Alzheimer's disease with late onset: Secondary | ICD-10-CM

## 2016-09-07 NOTE — Progress Notes (Signed)
Assisted Living Follow-up Visit  [facility]    [facility] RosebushBarbara Liu  DOB: 04/19/1926    Visit Date: 09/07/16  Facility:  [facility]      CC: UTI    HPI:    Patient is seen today in follow up evaluation and management of multiple medical conditions, both chronic and acute.  No recent falls , ED vists  or hospitalizations .  Weight stable   Nursing reports no issues  [facility]  Stated on Augmentin 2/5 due to UTI. Urinary symptoms resolved; no side effects from antibiotic  States, "I'm doing well, I feel good"    History Reviewed:     Past Medical History:   Diagnosis Date   ??? Glaucoma    ??? Hyperlipidemia        Allergies   Allergen Reactions   ??? Sulfa Antibiotics Other (See Comments)     rash       Current Outpatient Prescriptions   Medication Sig Dispense Refill   ??? dorzolamide-timolol (COSOPT) 22.3-6.8 MG/ML ophthalmic solution Use 1 Drop in the right eye twice daily.     ??? brimonidine (ALPHAGAN) 0.2 % ophthalmic solution 1 drop 3 times daily     ??? aspirin 81 MG tablet Take 81 mg by mouth daily     ??? Omega-3 Fatty Acids (OMEGA 3 PO) Take by mouth     ??? Multiple Vitamins-Minerals (CENTRUM SILVER PO) Take by mouth     ??? bimatoprost (LUMIGAN) 0.01 % SOLN ophthalmic drops Place 1 drop into the right eye nightly       No current facility-administered medications for this visit.        Review of Systems   Constitutional: Negative for activity change, appetite change, chills, fatigue and fever.   HENT: Negative for rhinorrhea and trouble swallowing.    Eyes: Negative for visual disturbance.   Respiratory: Negative for cough and shortness of breath.    Cardiovascular: Negative for chest pain and leg swelling.   Gastrointestinal: Negative for abdominal pain, constipation, diarrhea and nausea.   Genitourinary: Negative for dysuria and frequency.   Musculoskeletal: Negative for arthralgias and myalgias.   Skin: Negative for rash.    Neurological: Negative for dizziness, tremors, weakness and light-headedness.   Psychiatric/Behavioral: Negative for confusion, dysphoric mood and sleep disturbance.       BP (!) 103/53    Pulse 67    Temp 98.2 ??F (36.8 ??C)    Resp 20    Wt 123 lb 4.8 oz (55.9 kg)    SpO2 95%    BMI 21.16 kg/m??     Physical Exam   Constitutional: She appears well-developed and well-nourished.   Appropriately dressed and groomed elderly female   HENT:   Head: Normocephalic and atraumatic.   Right Ear: External ear normal.   Left Ear: External ear normal.   Mouth/Throat: Oropharynx is clear and moist.   Hard of hearing   Eyes: Conjunctivae are normal.   Neck: Normal range of motion.   Cardiovascular: Normal rate, regular rhythm and intact distal pulses.    Murmur heard.  Pulmonary/Chest: Effort normal and breath sounds normal. No respiratory distress. She has no wheezes.   Abdominal: Soft. Bowel sounds are normal. She exhibits no distension. There is no tenderness.   Musculoskeletal: She exhibits edema (trace b/l ankles).   Neurological: She is alert.   Oriented to person, place   Skin:  Skin is warm and dry.   Psychiatric: She has a normal mood and affect. Her behavior is normal.   Nursing note and vitals reviewed.      Recent Labs:    Lab Results   Component Value Date    WBC 8.4 05/16/2016    HGB 15.4 05/16/2016    HCT 46.0 05/16/2016    MCV 101.5 (H) 05/16/2016    PLT 204 05/16/2016    LYMPHOPCT 29.1 05/16/2016    GRANULOCYTES 58.9 05/16/2016    RBC 4.53 05/16/2016    MCH 34.1 (H) 05/16/2016    MCHC 33.6 05/16/2016    RDW 14.1 05/16/2016       Lab Results   Component Value Date    NA 134 (L) 05/16/2016    K 3.5 05/16/2016    CL 98 05/16/2016    CO2 26 05/16/2016    BUN 11 05/16/2016    CREATININE 0.79 05/16/2016    GLUCOSE 126 (H) 05/16/2016    CALCIUM 9.4 05/16/2016    PROT 7.8 05/16/2016    LABALBU 3.7 05/16/2016    BILITOT 1.0 05/16/2016    ALKPHOS 131 (H) 05/16/2016    AST 42 (H) 05/16/2016    ALT 39 05/16/2016    LABGLOM 56  (A) 06/10/2014    GFRAA >60 06/10/2014    AGRATIO 1.1 06/10/2014    GLOB 3.4 06/10/2014       Lab Results   Component Value Date    TSH 1.950 05/16/2016       No results found for: LABA1C    No results found for: INR, PROTIME      Assessment and Plan:    1. Late onset Alzheimer's disease without behavioral disturbance      continue sto benefit from AL level of care in the secure memory unit due to risk of wandering.  No behaviors   2. Acute cystitis without hematuria      Complete course of Augmentin  Notify provider if develops recurrent symptoms or side effects       Discontinued Medications    No medications on file           Electronically signed by Ethelda Chick, DO on 09/07/2016 at 10:31 AM

## 2016-09-21 ENCOUNTER — Encounter: Payer: MEDICARE | Attending: Geriatric Medicine

## 2016-09-21 DIAGNOSIS — G301 Alzheimer's disease with late onset: Secondary | ICD-10-CM

## 2016-09-21 NOTE — Progress Notes (Signed)
Assisted Living Follow-up Visit  [facility]    [facility]  DOB: 09-Apr-1926    Visit Date: 09/21/16  [facility]      CC: Transition    HPI:    Nursing requested patient evaluated today for upcoming transition to facility in [place] where her son lives.  Looking forward to the transition and being closer to family.    History Reviewed:     Past Medical History:   Diagnosis Date   ??? Glaucoma    ??? Hyperlipidemia        Allergies   Allergen Reactions   ??? Sulfa Antibiotics Other (See Comments)     rash       Current Outpatient Prescriptions   Medication Sig Dispense Refill   ??? dorzolamide-timolol (COSOPT) 22.3-6.8 MG/ML ophthalmic solution Use 1 Drop in the right eye twice daily.     ??? brimonidine (ALPHAGAN) 0.2 % ophthalmic solution 1 drop 3 times daily     ??? aspirin 81 MG tablet Take 81 mg by mouth daily     ??? Omega-3 Fatty Acids (OMEGA 3 PO) Take by mouth     ??? Multiple Vitamins-Minerals (CENTRUM SILVER PO) Take by mouth     ??? bimatoprost (LUMIGAN) 0.01 % SOLN ophthalmic drops Place 1 drop into the right eye nightly       No current facility-administered medications for this visit.        Review of Systems   Constitutional: Negative for activity change, appetite change, chills, fatigue and fever.   HENT: Negative for rhinorrhea and trouble swallowing.    Eyes: Negative for visual disturbance.   Respiratory: Negative for cough and shortness of breath.    Cardiovascular: Negative for chest pain and leg swelling.   Gastrointestinal: Negative for abdominal pain, constipation, diarrhea and nausea.   Genitourinary: Negative for dysuria and frequency.   Musculoskeletal: Negative for arthralgias and myalgias.   Skin: Negative for rash.   Neurological: Negative for dizziness, tremors, weakness and light-headedness.   Psychiatric/Behavioral: Negative for confusion, dysphoric mood and sleep disturbance.       BP 127/69    Pulse 65    Temp 97 ??F (36.1 ??C)    Resp 18    SpO2 98%      Physical Exam   Constitutional: She appears well-developed and well-nourished.   Appropriately dressed and groomed elderly female   HENT:   Head: Normocephalic and atraumatic.   Right Ear: External ear normal.   Left Ear: External ear normal.   Mouth/Throat: Oropharynx is clear and moist.   Hard of hearing   Eyes: Conjunctivae are normal.   Neck: Normal range of motion. Neck supple. No thyromegaly present.   Cardiovascular: Normal rate, regular rhythm and intact distal pulses.    Murmur heard.  Pulmonary/Chest: Effort normal and breath sounds normal. No respiratory distress. She has no wheezes.   Abdominal: Soft. Bowel sounds are normal. She exhibits no distension. There is no tenderness.   Musculoskeletal: She exhibits no edema (trace b/l ankles).   Lymphadenopathy:     She has no cervical adenopathy.   Neurological: She is alert.   Skin: Skin is warm and dry.   Psychiatric: She has a normal mood and affect. Her behavior is normal. Thought content normal.   Nursing note and vitals reviewed.      Recent Labs:    Lab Results   Component Value Date    WBC 8.4 05/16/2016  HGB 15.4 05/16/2016    HCT 46.0 05/16/2016    MCV 101.5 (H) 05/16/2016    PLT 204 05/16/2016    LYMPHOPCT 29.1 05/16/2016    GRANULOCYTES 58.9 05/16/2016    RBC 4.53 05/16/2016    MCH 34.1 (H) 05/16/2016    MCHC 33.6 05/16/2016    RDW 14.1 05/16/2016       Lab Results   Component Value Date    NA 134 (L) 05/16/2016    K 3.5 05/16/2016    CL 98 05/16/2016    CO2 26 05/16/2016    BUN 11 05/16/2016    CREATININE 0.79 05/16/2016    GLUCOSE 126 (H) 05/16/2016    CALCIUM 9.4 05/16/2016    PROT 7.8 05/16/2016    LABALBU 3.7 05/16/2016    BILITOT 1.0 05/16/2016    ALKPHOS 131 (H) 05/16/2016    AST 42 (H) 05/16/2016    ALT 39 05/16/2016    LABGLOM 56 (A) 06/10/2014    GFRAA >60 06/10/2014    AGRATIO 1.1 06/10/2014    GLOB 3.4 06/10/2014       Lab Results   Component Value Date    TSH 1.950 05/16/2016       No results found for: LABA1C    No results found  for: INR, PROTIME      Assessment and Plan:    1. Late onset Alzheimer's disease without behavioral disturbance     Facility form completed  Will continue to benefit from secure memory unit due to history of poor judgement and insight as evidenced by wandering behaviors.    Discontinued Medications    No medications on file           Electronically signed by Ethelda ChickJessica Macarius Ruark, DO on 09/21/2016 at 10:41 AM

## 2017-02-20 ENCOUNTER — Emergency Department (HOSPITAL_COMMUNITY): Payer: Medicare (Managed Care)

## 2017-02-20 ENCOUNTER — Encounter (HOSPITAL_COMMUNITY): Payer: Self-pay | Admitting: *Deleted

## 2017-02-20 ENCOUNTER — Observation Stay (HOSPITAL_COMMUNITY)
Admission: EM | Admit: 2017-02-20 | Discharge: 2017-02-22 | Disposition: A | Discharge status: Skilled Nursing Facility | Payer: Medicare (Managed Care) | Attending: Internal Medicine | Admitting: Internal Medicine

## 2017-02-20 DIAGNOSIS — S82001A Unspecified fracture of right patella, initial encounter for closed fracture: Secondary | ICD-10-CM

## 2017-02-20 DIAGNOSIS — Z79899 Other long term (current) drug therapy: Secondary | ICD-10-CM | POA: Insufficient documentation

## 2017-02-20 DIAGNOSIS — Y92129 Unspecified place in nursing home as the place of occurrence of the external cause: Secondary | ICD-10-CM | POA: Insufficient documentation

## 2017-02-20 DIAGNOSIS — W19XXXA Unspecified fall, initial encounter: Secondary | ICD-10-CM

## 2017-02-20 DIAGNOSIS — M4854XA Collapsed vertebra, not elsewhere classified, thoracic region, initial encounter for fracture: Secondary | ICD-10-CM | POA: Insufficient documentation

## 2017-02-20 DIAGNOSIS — Z9181 History of falling: Secondary | ICD-10-CM | POA: Diagnosis not present

## 2017-02-20 DIAGNOSIS — Y939 Activity, unspecified: Secondary | ICD-10-CM | POA: Diagnosis not present

## 2017-02-20 DIAGNOSIS — Z882 Allergy status to sulfonamides status: Secondary | ICD-10-CM | POA: Insufficient documentation

## 2017-02-20 DIAGNOSIS — S0083XA Contusion of other part of head, initial encounter: Secondary | ICD-10-CM | POA: Insufficient documentation

## 2017-02-20 DIAGNOSIS — S82002A Unspecified fracture of left patella, initial encounter for closed fracture: Secondary | ICD-10-CM | POA: Diagnosis not present

## 2017-02-20 DIAGNOSIS — G9389 Other specified disorders of brain: Secondary | ICD-10-CM | POA: Diagnosis not present

## 2017-02-20 DIAGNOSIS — Z7982 Long term (current) use of aspirin: Secondary | ICD-10-CM | POA: Diagnosis not present

## 2017-02-20 DIAGNOSIS — H409 Unspecified glaucoma: Secondary | ICD-10-CM | POA: Diagnosis not present

## 2017-02-20 DIAGNOSIS — S82035A Nondisplaced transverse fracture of left patella, initial encounter for closed fracture: Secondary | ICD-10-CM | POA: Diagnosis not present

## 2017-02-20 DIAGNOSIS — Z87891 Personal history of nicotine dependence: Secondary | ICD-10-CM | POA: Insufficient documentation

## 2017-02-20 DIAGNOSIS — S82036A Nondisplaced transverse fracture of unspecified patella, initial encounter for closed fracture: Secondary | ICD-10-CM

## 2017-02-20 DIAGNOSIS — M8580 Other specified disorders of bone density and structure, unspecified site: Secondary | ICD-10-CM | POA: Insufficient documentation

## 2017-02-20 DIAGNOSIS — W010XXA Fall on same level from slipping, tripping and stumbling without subsequent striking against object, initial encounter: Secondary | ICD-10-CM | POA: Insufficient documentation

## 2017-02-20 DIAGNOSIS — F0390 Unspecified dementia without behavioral disturbance: Secondary | ICD-10-CM | POA: Diagnosis not present

## 2017-02-20 HISTORY — DX: Unspecified glaucoma: H40.9

## 2017-02-20 LAB — COMPREHENSIVE METABOLIC PANEL
ALT: 18 U/L (ref 14–54)
AST: 26 U/L (ref 15–41)
Albumin: 3.5 g/dL (ref 3.5–5.0)
Alkaline Phosphatase: 60 U/L (ref 38–126)
Anion gap: 8 (ref 5–15)
BUN: 11 mg/dL (ref 6–20)
CHLORIDE: 105 mmol/L (ref 101–111)
CO2: 22 mmol/L (ref 22–32)
CREATININE: 0.78 mg/dL (ref 0.44–1.00)
Calcium: 8.6 mg/dL — ABNORMAL LOW (ref 8.9–10.3)
GFR calc Af Amer: >60 mL/min (ref >60)
Glucose, Bld: 154 mg/dL — ABNORMAL HIGH (ref 65–99)
POTASSIUM: 4.1 mmol/L (ref 3.5–5.1)
Sodium: 135 mmol/L (ref 135–145)
Total Bilirubin: 0.8 mg/dL (ref 0.3–1.2)
Total Protein: 7.1 g/dL (ref 6.5–8.1)

## 2017-02-20 LAB — CBC WITH DIFFERENTIAL/PLATELET
BASOS PCT: 0 %
Basophils Absolute: 0 10*3/uL (ref 0.0–0.1)
EOS PCT: 1 %
Eosinophils Absolute: 0.1 10*3/uL (ref 0.0–0.7)
HCT: 39.2 % (ref 36.0–46.0)
Hemoglobin: 13.6 g/dL (ref 12.0–15.0)
LYMPHS ABS: 1.8 10*3/uL (ref 0.7–4.0)
Lymphocytes Relative: 15 %
MCH: 31.8 pg (ref 26.0–34.0)
MCHC: 34.7 g/dL (ref 30.0–36.0)
MCV: 91.6 fL (ref 78.0–100.0)
MONO ABS: 1.1 10*3/uL — AB (ref 0.1–1.0)
Monocytes Relative: 9 %
Neutro Abs: 9.1 10*3/uL — ABNORMAL HIGH (ref 1.7–7.7)
Neutrophils Relative %: 75 %
PLATELETS: 208 10*3/uL (ref 150–400)
RBC: 4.28 MIL/uL (ref 3.87–5.11)
RDW: 13 % (ref 11.5–15.5)
WBC: 12.1 10*3/uL — ABNORMAL HIGH (ref 4.0–10.5)

## 2017-02-20 MED ORDER — ACETAMINOPHEN 500 MG PO TABS
1000.0000 mg | ORAL_TABLET | Freq: Once | ORAL | Status: AC
  Administered 2017-02-20: 1000 mg via ORAL
  Filled 2017-02-20: qty 2

## 2017-02-20 MED ORDER — ACETAMINOPHEN 325 MG PO TABS
650.0000 mg | ORAL_TABLET | Freq: Four times a day (QID) | ORAL | Status: DC | PRN
  Administered 2017-02-21 – 2017-02-22 (×4): 650 mg via ORAL
  Filled 2017-02-20 (×6): qty 2

## 2017-02-20 MED ORDER — ACETAMINOPHEN 325 MG PO TABS: 650.0000 mg | ORAL_TABLET | Freq: Four times a day (QID) | ORAL | Status: DC | PRN

## 2017-02-20 MED ORDER — ENOXAPARIN SODIUM 40 MG/0.4ML ~~LOC~~ SOLN
40.0000 mg | Freq: Every day | SUBCUTANEOUS | Status: DC
  Administered 2017-02-21 (×2): 40 mg via SUBCUTANEOUS
  Filled 2017-02-20 (×2): qty 0.4

## 2017-02-20 MED ORDER — ONDANSETRON HCL 4 MG/2ML IJ SOLN: 4.0000 mg | Freq: Four times a day (QID) | INTRAMUSCULAR | Status: DC | PRN

## 2017-02-20 MED ORDER — ACETAMINOPHEN 650 MG RE SUPP: 650.0000 mg | Freq: Four times a day (QID) | RECTAL | Status: DC | PRN

## 2017-02-20 MED ORDER — BRIMONIDINE TARTRATE 0.2 % OP SOLN
1.0000 [drp] | Freq: Two times a day (BID) | OPHTHALMIC | Status: DC
  Administered 2017-02-21 – 2017-02-22 (×4): 1 [drp] via OPHTHALMIC
  Filled 2017-02-20: qty 5

## 2017-02-20 MED ORDER — TIMOLOL MALEATE 0.5 % OP SOLN
1.0000 [drp] | Freq: Two times a day (BID) | OPHTHALMIC | Status: DC
  Administered 2017-02-21 – 2017-02-22 (×4): 1 [drp] via OPHTHALMIC
  Filled 2017-02-20: qty 5

## 2017-02-20 MED ORDER — KETOROLAC TROMETHAMINE 0.5 % OP SOLN
1.0000 [drp] | Freq: Every morning | OPHTHALMIC | Status: DC
  Administered 2017-02-21 – 2017-02-22 (×2): 1 [drp] via OPHTHALMIC
  Filled 2017-02-20: qty 5

## 2017-02-20 MED ORDER — ASPIRIN EC 81 MG PO TBEC
81.0000 mg | DELAYED_RELEASE_TABLET | Freq: Every day | ORAL | Status: DC
  Administered 2017-02-21 – 2017-02-22 (×2): 81 mg via ORAL
  Filled 2017-02-20 (×2): qty 1

## 2017-02-20 MED ORDER — LATANOPROST 0.005 % OP SOLN
1.0000 [drp] | Freq: Every day | OPHTHALMIC | Status: DC
  Administered 2017-02-21 (×2): 1 [drp] via OPHTHALMIC
  Filled 2017-02-20: qty 2.5

## 2017-02-20 MED ORDER — BRINZOLAMIDE 1 % OP SUSP
1.0000 [drp] | Freq: Two times a day (BID) | OPHTHALMIC | Status: DC
  Administered 2017-02-21 – 2017-02-22 (×4): 1 [drp] via OPHTHALMIC
  Filled 2017-02-20: qty 10

## 2017-02-20 MED ORDER — ONDANSETRON HCL 4 MG PO TABS
4.0000 mg | ORAL_TABLET | Freq: Four times a day (QID) | ORAL | Status: DC | PRN
  Administered 2017-02-22: 12:00:00 4 mg via ORAL
  Filled 2017-02-20 (×2): qty 1

## 2017-02-20 NOTE — ED Triage Notes (Signed)
Per EMS:  [facility] Pt tripped over her feet In the hallway and fell. Pt did not hit her head. Pt hit her chin when she went down and has a bruise there. Pt is complaining of left flank pain. Pt reports no chest pain or SOB. Pt has a towel around her neck because c-collar would not fit. Pt was able to stand and took a few steps to the stretcher.  No LOC and pt is not on any blood thinners

## 2017-02-20 NOTE — ED Notes (Signed)
Bed: WA21 Expected date:  Expected time:  Means of arrival:  Comments: 83 yo fall

## 2017-02-20 NOTE — ED Provider Notes (Signed)
[facility]-EMERGENCY DEPT Provider Note   CSN: [id number] Arrival date & time: 02/20/17  1733     History   Chief Complaint Chief Complaint  Patient presents with  . Fall    HPI Sheri Payne is a 83 y.o. female.  HPI   Slipped on the floor and fell onto knees and hit chin.  Reports pain to chin, inner lip, right knee.  No LOC. Reports she is otherwise in good health. Pain is mild.  No neck pain, no numbness, no weakness, no chest pain, no abdominal pain. Reports that she has history of back pain, and feels this is been exacerbated by the fall.  Sheri Payne son [phone]  Past Medical History:  Diagnosis Date  . Glaucoma     Patient Active Problem List   Diagnosis Date Noted  . Closed nondisplaced fracture of right patella, initial encounter 02/20/2017  . Closed nondisplaced fracture of left patella, initial encounter 02/20/2017    Past Surgical History:  Procedure Laterality Date  . APPENDECTOMY    . EYE SURGERY Right    pt unsure what type of eye surgery but states in right eye    OB History    No data available       Home Medications    Prior to Admission medications   Medication Sig Start Date End Date Taking? Authorizing Provider  aspirin EC 81 MG tablet Take 81 mg by mouth daily.   Yes [provider]  bimatoprost (LUMIGAN) 0.03 % ophthalmic solution Place 1 drop into the right eye at bedtime. 02/18/17  Yes [provider]  brimonidine (ALPHAGAN) 0.2 % ophthalmic solution Place 1 drop into the right eye 2 (two) times daily.   Yes [provider]  brinzolamide (AZOPT) 1 % ophthalmic suspension Place 1 drop into the right eye 2 (two) times daily.   Yes [provider]  diclofenac (VOLTAREN) 0.1 % ophthalmic solution Place 1 drop into the right eye every morning.   Yes [provider]  Melatonin 3 MG CAPS Take 3 mg by mouth at bedtime as needed (sleep).   Yes [provider]  timolol (TIMOPTIC) 0.5 %  ophthalmic solution Place 1 drop into the right eye 2 (two) times daily. 09/14/16  Yes [provider]    Family History History reviewed. No pertinent family history.  Social History Social History  Substance Use Topics  . Smoking status: Former Smoker    Types: Cigarettes    Quit date: 02/20/1945  . Smokeless tobacco: Never Used  . Alcohol use Yes     Comment: 1 martini a day most days     Allergies   Sulfa antibiotics   Review of Systems Review of Systems  Constitutional: Negative for fever.  HENT: Negative for sore throat.   Eyes: Negative for visual disturbance.  Respiratory: Negative for cough and shortness of breath.   Cardiovascular: Negative for chest pain.  Gastrointestinal: Negative for abdominal pain, nausea and vomiting.  Genitourinary: Negative for difficulty urinating.  Musculoskeletal: Positive for arthralgias and back pain. Negative for neck pain.  Skin: Negative for rash.  Neurological: Negative for syncope and headaches.     Physical Exam Updated Vital Signs BP (!) 149/72 (BP Location: Left Arm)   Pulse 78   Temp 98.1 F (36.7 C) (Oral)   Resp 18   Ht 5\' 5"  (1.651 m)   Wt 57.3 kg (126 lb 5.2 oz)   SpO2 96%   BMI 21.02 kg/m   Physical Exam  Constitutional: She is oriented to person, place, and time. She appears well-developed and well-nourished. No distress.  HENT:  Head: Normocephalic and atraumatic.  Eyes: Conjunctivae and EOM are normal.  Neck: Normal range of motion.  Cardiovascular: Normal rate, regular rhythm, normal heart sounds and intact distal pulses.  Exam reveals no gallop and no friction rub.   No murmur heard. Pulmonary/Chest: Effort normal and breath sounds normal. No respiratory distress. She has no wheezes. She has no rales.  Abdominal: Soft. She exhibits no distension. There is no tenderness. There is no guarding.  Musculoskeletal: She exhibits no edema.       Right knee: She exhibits decreased range of motion  (pain) and swelling. She exhibits no deformity. Tenderness found.       Left knee: She exhibits decreased range of motion and swelling.       Cervical back: She exhibits no bony tenderness.       Thoracic back: She exhibits no bony tenderness.       Lumbar back: She exhibits no bony tenderness.  Able to hold both knees in extension when lifted, will not lift off of bed actively due to pain in knees with movement. No hip tenderness. No ankle tenderness. Initially reports back pain then denies   Neurological: She is alert and oriented to person, place, and time.  Skin: Skin is warm and dry. No rash noted. She is not diaphoretic. No erythema.  Nursing note and vitals reviewed.    ED Treatments / Results  Labs (all labs ordered are listed, but only abnormal results are displayed) Labs Reviewed  CBC WITH DIFFERENTIAL/PLATELET - Abnormal; Notable for the following:       Result Value   WBC 12.1 (*)    Neutro Abs 9.1 (*)    Monocytes Absolute 1.1 (*)    All other components within normal limits  COMPREHENSIVE METABOLIC PANEL - Abnormal; Notable for the following:    Glucose, Bld 154 (*)    Calcium 8.6 (*)    All other components within normal limits    EKG  EKG Interpretation None       Radiology Dg Lumbar Spine Complete  Result Date: 02/20/2017 CLINICAL DATA:  Trip and fall with low back pain, initial encounter EXAM: LUMBAR SPINE - COMPLETE 4+ VIEW COMPARISON:  None. FINDINGS: Five lumbar type vertebral bodies are well visualized. Mild osteopenia is noted. Compression deformities are noted at T10, T12 and L1. They have a more chronic appearance although an acute component cannot be excluded on the basis of this exam. Mild degenerative changes in the facet joints are seen. No anterolisthesis is noted. No other focal abnormality is noted. IMPRESSION: T10, T12 and L1 and compression deformities likely of a chronic nature. Correlation to point tenderness is recommended. CT may be  helpful for further evaluation. Electronically Signed   By: Alcide CleverMark  Lukens M.D.   On: 02/20/2017 19:58   Ct Head Wo Contrast  Result Date: 02/20/2017 CLINICAL DATA:  Trip and fall. EXAM: CT HEAD WITHOUT CONTRAST CT CERVICAL SPINE WITHOUT CONTRAST TECHNIQUE: Multidetector CT imaging of the head and cervical spine was performed following the standard protocol without intravenous contrast. Multiplanar CT image reconstructions of the cervical spine were also generated. COMPARISON:  Head CT in 04/2021 10 FINDINGS: CT HEAD FINDINGS Brain: No acute hemorrhage. No evidence of acute ischemia. Generalized atrophy and chronic small vessel ischemia, with mild progression from prior exam. There is minimal encephalomalacia in the right temporal occipital lobe consistent with remote  infarct. No subdural or extra-axial fluid collection. No hydrocephalus, the basilar cisterns are patent. Gray-white differentiation is preserved. Vascular: Atherosclerosis of skullbase vasculature without hyperdense vessel or abnormal calcification. Skull: There is diffuse bony demineralization. No skull fracture. No focal lesion. Sinuses/Orbits: Paranasal sinuses and mastoid air cells are clear. The visualized orbits are unremarkable. Probable bilateral cataract resection. Other: None. CT CERVICAL SPINE FINDINGS Alignment: Normal. Skull base and vertebrae: Mild compression fracture of T1 with approximately 25% loss of height centrally, age indeterminate. No adjacent soft tissue edema to suggest acute fracture. Cervical vertebral body heights are maintained. The dens and skull base are intact. Diffuse bony under mineralization. Soft tissues and spinal canal: No prevertebral fluid or swelling. No visible canal hematoma. Disc levels: Endplate spurring with preservation of disc spaces. Scattered facet arthropathy, normal for age. Upper chest: Mild right apical pleuroparenchymal scarring. No acute abnormality. Other: Mild carotid calcifications.  IMPRESSION: 1. No acute intracranial abnormality. Age related atrophy and chronic small vessel ischemia. 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 2. T1 compression fracture, technically age indeterminate but likely remote. No prior exams for comparison. No fracture or subluxation of the cervical spine. Electronically Signed   By: Rubye Oaks M.D.   On: 02/20/2017 20:19   Ct Cervical Spine Wo Contrast  Result Date: 02/20/2017 CLINICAL DATA:  Trip and fall. EXAM: CT HEAD WITHOUT CONTRAST CT CERVICAL SPINE WITHOUT CONTRAST TECHNIQUE: Multidetector CT imaging of the head and cervical spine was performed following the standard protocol without intravenous contrast. Multiplanar CT image reconstructions of the cervical spine were also generated. COMPARISON:  Head CT in 04/2021 10 FINDINGS: CT HEAD FINDINGS Brain: No acute hemorrhage. No evidence of acute ischemia. Generalized atrophy and chronic small vessel ischemia, with mild progression from prior exam. There is minimal encephalomalacia in the right temporal occipital lobe consistent with remote infarct. No subdural or extra-axial fluid collection. No hydrocephalus, the basilar cisterns are patent. Gray-white differentiation is preserved. Vascular: Atherosclerosis of skullbase vasculature without hyperdense vessel or abnormal calcification. Skull: There is diffuse bony demineralization. No skull fracture. No focal lesion. Sinuses/Orbits: Paranasal sinuses and mastoid air cells are clear. The visualized orbits are unremarkable. Probable bilateral cataract resection. Other: None. CT CERVICAL SPINE FINDINGS Alignment: Normal. Skull base and vertebrae: Mild compression fracture of T1 with approximately 25% loss of height centrally, age indeterminate. No adjacent soft tissue edema to suggest acute fracture. Cervical vertebral body heights are maintained. The dens and skull base are intact. Diffuse bony  under mineralization. Soft tissues and spinal canal: No prevertebral fluid or swelling. No visible canal hematoma. Disc levels: Endplate spurring with preservation of disc spaces. Scattered facet arthropathy, normal for age. Upper chest: Mild right apical pleuroparenchymal scarring. No acute abnormality. Other: Mild carotid calcifications. IMPRESSION: 1. No acute intracranial abnormality. Age related atrophy and chronic small vessel ischemia. 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 2. T1 compression fracture, technically age indeterminate but likely remote. No prior exams for comparison. No fracture or subluxation of the cervical spine. Electronically Signed   By: Rubye Oaks M.D.   On: 02/20/2017 20:19   Dg Knee Complete 4 Views Left  Result Date: 02/20/2017 CLINICAL DATA:  Recent trip and fall with left knee pain, initial encounter EXAM: LEFT KNEE - COMPLETE 4+ VIEW COMPARISON:  None. FINDINGS: There is a transverse fracture through the patella in the midbody without significant distraction of the fracture  fragments. Joint effusion is seen. No other fracture is noted. IMPRESSION: Mid patellar fracture without fracture fragment distraction. Joint effusion is noted. Electronically Signed   By: Alcide Clever M.D.   On: 02/20/2017 19:59   Dg Knee Complete 4 Views Right  Result Date: 02/20/2017 CLINICAL DATA:  Recent trip and fall with right knee pain, initial encounter EXAM: RIGHT KNEE - COMPLETE 4+ VIEW COMPARISON:  None. FINDINGS: Mid patellar fracture is noted without significant fracture distraction. Large joint effusion is seen. No other focal abnormality is noted. IMPRESSION: Mid patellar fracture without significant distraction of the fracture fragments. Joint effusion is noted. Electronically Signed   By: Alcide Clever M.D.   On: 02/20/2017 20:00    Procedures Procedures (including critical care time)  Medications Ordered in  ED Medications  timolol (TIMOPTIC) 0.5 % ophthalmic solution 1 drop (1 drop Right Eye Given 02/21/17 0022)  ketorolac (ACULAR) 0.5 % ophthalmic solution 1 drop (not administered)  brimonidine (ALPHAGAN) 0.2 % ophthalmic solution 1 drop (1 drop Right Eye Given 02/21/17 0021)  brinzolamide (AZOPT) 1 % ophthalmic suspension 1 drop (1 drop Right Eye Given 02/21/17 0023)  aspirin EC tablet 81 mg (not administered)  latanoprost (XALATAN) 0.005 % ophthalmic solution 1 drop (1 drop Right Eye Given 02/21/17 0023)  enoxaparin (LOVENOX) injection 40 mg (40 mg Subcutaneous Given 02/21/17 0021)  ondansetron (ZOFRAN) tablet 4 mg (not administered)    Or  ondansetron (ZOFRAN) injection 4 mg (not administered)  acetaminophen (TYLENOL) tablet 650 mg (not administered)    Or  acetaminophen (TYLENOL) suppository 650 mg (not administered)  acetaminophen (TYLENOL) tablet 1,000 mg (1,000 mg Oral Given 02/20/17 1959)     Initial Impression / Assessment and Plan / ED Course  I have reviewed the triage vital signs and the nursing notes.  Pertinent labs & imaging results that were available during my care of the patient were reviewed by me and considered in my medical decision making (see chart for details).     83 year old female is in the memory care facility at the [facility] with history of dementia, presents with concern for mechanical fall. Patient with chin contusion, mild intraoral abrasion. CT head and cervical spine without acute abnormalities. Patient had reported chronic back pain have been exacerbated by fall. X-ray showed compression fractures which are age indeterminant. On reexamination, patient denies any tenderness, and suspect these are likely older fractures given no tenderness now.  X-ray showed bilateral patellar fractures. Patient with extensor function intact, able to hold both legs in the air in extension. She is otherwise neurovascularly intact. Patellar fractures are nondisplaced. Treatment  will be nonsurgical with bilateral knee immobilizers. Discussed with Dr. Charlann Boxer of orthopedics, who recommends bilateral knee immobilizers. Given patient currently resides in the assisted living portion of her memory care facility, and now has new limitation of bilateral knee immobilizers WBAT, feel she requires further evaluation with physical therapy, and determination of whether she requires a rehabilitation facility for further care. Admitted for further care.   Final Clinical Impressions(s) / ED Diagnoses   Final diagnoses:  Fall, initial encounter  Closed nondisplaced transverse fracture of patella, unspecified laterality, initial encounter    New Prescriptions Current Discharge Medication List       Alvira Monday, MD 02/21/17 0222

## 2017-02-20 NOTE — H&P (Signed)
History and Physical    Sheri Payne ZOX:[id number] DOB: 07-27-1926 DOA: 02/20/2017  PCP: System, Pcp Not In  Patient coming from: [facility]  I have personally briefly reviewed patient's old medical records in [facility]  Chief Complaint: Fall  HPI: Sheri Payne is a 83 y.o. female with medical history significant of glaucoma and perhaps mild dementia but otherwise in good health, lives in ALF.  Patient presents to the ED following a fall at her facility.  In addition to bruising her chin, she has bilateral patellar fractures that are non-displaced.  She is in no pain as long as she isnt moving her legs.   ED Course: Ortho consulted, patient in bilateral knee immobilizers.  Technically a WBAT injury, given bilateral knee imobilizers in an 83 yo F who is high fall risk (just had fall) to begin with, EDP suspects she may not be able to go back to ALF ambulatory at the moment.  Getting patient admitted for PT/OT and SW consults.   Review of Systems: As per HPI otherwise 10 point review of systems negative.   Past Medical History:  Diagnosis Date  . Glaucoma     Past Surgical History:  Procedure Laterality Date  . APPENDECTOMY    . EYE SURGERY Right    pt unsure what type of eye surgery but states in right eye     reports that she quit smoking about 72 years ago. Her smoking use included Cigarettes. She has never used smokeless tobacco. She reports that she drinks alcohol. She reports that she does not use drugs.  Allergies  Allergen Reactions  . Sulfa Antibiotics Rash    Other reaction(s): Intolerance    History reviewed. No pertinent family history.   Prior to Admission medications   Medication Sig Start Date End Date Taking? Authorizing Provider  aspirin EC 81 MG tablet Take 81 mg by mouth daily.   Yes [provider]  bimatoprost (LUMIGAN) 0.03 % ophthalmic solution Place 1 drop into the right eye at bedtime. 02/18/17  Yes [provider]  brimonidine (ALPHAGAN) 0.2 % ophthalmic solution Place 1 drop into the right eye 2 (two) times daily.   Yes [provider]  brinzolamide (AZOPT) 1 % ophthalmic suspension Place 1 drop into the right eye 2 (two) times daily.   Yes [provider]  diclofenac (VOLTAREN) 0.1 % ophthalmic solution Place 1 drop into the right eye every morning.   Yes [provider]  Melatonin 3 MG CAPS Take 3 mg by mouth at bedtime as needed (sleep).   Yes [provider]  timolol (TIMOPTIC) 0.5 % ophthalmic solution Place 1 drop into the right eye 2 (two) times daily. 09/14/16  Yes [provider]    Physical Exam: Vitals:   02/20/17 1741 02/20/17 2145  BP: (!) 163/83 129/73  Pulse: 64 78  Resp: 18 18  Temp: 97.7 F (36.5 C)   TempSrc: Oral   SpO2: 96% 99%    Constitutional: NAD, calm, comfortable Eyes: PERRL, lids and conjunctivae normal ENMT: Mucous membranes are moist. Posterior pharynx clear of any exudate or lesions.Normal dentition.  Neck: normal, supple, no masses, no thyromegaly Respiratory: clear to auscultation bilaterally, no wheezing, no crackles. Normal respiratory effort. No accessory muscle use.  Cardiovascular: Regular rate and rhythm, no murmurs / rubs / gallops. No extremity edema. 2+ pedal pulses. No carotid bruits.  Abdomen: no tenderness, no masses palpated. No hepatosplenomegaly. Bowel sounds positive.  Musculoskeletal: no clubbing /  cyanosis. No joint deformity upper and lower extremities. Good ROM, no contractures. Normal muscle tone.  Skin: no rashes, lesions, ulcers. No induration Neurologic: CN 2-12 grossly intact. Sensation intact, DTR normal. Strength 5/5 in all 4.  Psychiatric: Normal judgment and insight. Alert and oriented x 3. Normal mood.    Labs on Admission: I have personally reviewed following labs and imaging studies  CBC:  Recent Labs Lab 02/20/17 2050  WBC 12.1*  NEUTROABS 9.1*  HGB 13.6  HCT  39.2  MCV 91.6  PLT 208   Basic Metabolic Panel:  Recent Labs Lab 02/20/17 2050  NA 135  K 4.1  CL 105  CO2 22  GLUCOSE 154*  BUN 11  CREATININE 0.78  CALCIUM 8.6*   GFR: CrCl cannot be calculated (Unknown ideal weight.). Liver Function Tests:  Recent Labs Lab 02/20/17 2050  AST 26  ALT 18  ALKPHOS 60  BILITOT 0.8  PROT 7.1  ALBUMIN 3.5   No results for input(s): LIPASE, AMYLASE in the last 168 hours. No results for input(s): AMMONIA in the last 168 hours. Coagulation Profile: No results for input(s): INR, PROTIME in the last 168 hours. Cardiac Enzymes: No results for input(s): CKTOTAL, CKMB, CKMBINDEX, TROPONINI in the last 168 hours. BNP (last 3 results) No results for input(s): PROBNP in the last 8760 hours. HbA1C: No results for input(s): HGBA1C in the last 72 hours. CBG: No results for input(s): GLUCAP in the last 168 hours. Lipid Profile: No results for input(s): CHOL, HDL, LDLCALC, TRIG, CHOLHDL, LDLDIRECT in the last 72 hours. Thyroid Function Tests: No results for input(s): TSH, T4TOTAL, FREET4, T3FREE, THYROIDAB in the last 72 hours. Anemia Panel: No results for input(s): VITAMINB12, FOLATE, FERRITIN, TIBC, IRON, RETICCTPCT in the last 72 hours. Urine analysis: No results found for: COLORURINE, APPEARANCEUR, LABSPEC, PHURINE, GLUCOSEU, HGBUR, BILIRUBINUR, KETONESUR, PROTEINUR, UROBILINOGEN, NITRITE, LEUKOCYTESUR  Radiological Exams on Admission: Dg Lumbar Spine Complete  Result Date: 02/20/2017 CLINICAL DATA:  Trip and fall with low back pain, initial encounter EXAM: LUMBAR SPINE - COMPLETE 4+ VIEW COMPARISON:  None. FINDINGS: Five lumbar type vertebral bodies are well visualized. Mild osteopenia is noted. Compression deformities are noted at T10, T12 and L1. They have a more chronic appearance although an acute component cannot be excluded on the basis of this exam. Mild degenerative changes in the facet joints are seen. No anterolisthesis is noted.  No other focal abnormality is noted. IMPRESSION: T10, T12 and L1 and compression deformities likely of a chronic nature. Correlation to point tenderness is recommended. CT may be helpful for further evaluation. Electronically Signed   By: Alcide Clever M.D.   On: 02/20/2017 19:58   Ct Head Wo Contrast  Result Date: 02/20/2017 CLINICAL DATA:  Trip and fall. EXAM: CT HEAD WITHOUT CONTRAST CT CERVICAL SPINE WITHOUT CONTRAST TECHNIQUE: Multidetector CT imaging of the head and cervical spine was performed following the standard protocol without intravenous contrast. Multiplanar CT image reconstructions of the cervical spine were also generated. COMPARISON:  Head CT in 04/2021 10 FINDINGS: CT HEAD FINDINGS Brain: No acute hemorrhage. No evidence of acute ischemia. Generalized atrophy and chronic small vessel ischemia, with mild progression from prior exam. There is minimal encephalomalacia in the right temporal occipital lobe consistent with remote infarct. No subdural or extra-axial fluid collection. No hydrocephalus, the basilar cisterns are patent. Gray-white differentiation is preserved. Vascular: Atherosclerosis of skullbase vasculature without hyperdense vessel or abnormal calcification. Skull: There is diffuse bony demineralization. No skull fracture. No focal lesion. Sinuses/Orbits:  Paranasal sinuses and mastoid air cells are clear. The visualized orbits are unremarkable. Probable bilateral cataract resection. Other: None. CT CERVICAL SPINE FINDINGS Alignment: Normal. Skull base and vertebrae: Mild compression fracture of T1 with approximately 25% loss of height centrally, age indeterminate. No adjacent soft tissue edema to suggest acute fracture. Cervical vertebral body heights are maintained. The dens and skull base are intact. Diffuse bony under mineralization. Soft tissues and spinal canal: No prevertebral fluid or swelling. No visible canal hematoma. Disc levels: Endplate spurring with preservation of disc  spaces. Scattered facet arthropathy, normal for age. Upper chest: Mild right apical pleuroparenchymal scarring. No acute abnormality. Other: Mild carotid calcifications. IMPRESSION: 1. No acute intracranial abnormality. Age related atrophy and chronic small vessel ischemia. 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 2. T1 compression fracture, technically age indeterminate but likely remote. No prior exams for comparison. No fracture or subluxation of the cervical spine. Electronically Signed   By: Rubye Oaks M.D.   On: 02/20/2017 20:19   Ct Cervical Spine Wo Contrast  Result Date: 02/20/2017 CLINICAL DATA:  Trip and fall. EXAM: CT HEAD WITHOUT CONTRAST CT CERVICAL SPINE WITHOUT CONTRAST TECHNIQUE: Multidetector CT imaging of the head and cervical spine was performed following the standard protocol without intravenous contrast. Multiplanar CT image reconstructions of the cervical spine were also generated. COMPARISON:  Head CT in 04/2021 10 FINDINGS: CT HEAD FINDINGS Brain: No acute hemorrhage. No evidence of acute ischemia. Generalized atrophy and chronic small vessel ischemia, with mild progression from prior exam. There is minimal encephalomalacia in the right temporal occipital lobe consistent with remote infarct. No subdural or extra-axial fluid collection. No hydrocephalus, the basilar cisterns are patent. Gray-white differentiation is preserved. Vascular: Atherosclerosis of skullbase vasculature without hyperdense vessel or abnormal calcification. Skull: There is diffuse bony demineralization. No skull fracture. No focal lesion. Sinuses/Orbits: Paranasal sinuses and mastoid air cells are clear. The visualized orbits are unremarkable. Probable bilateral cataract resection. Other: None. CT CERVICAL SPINE FINDINGS Alignment: Normal. Skull base and vertebrae: Mild compression fracture of T1 with approximately 25% loss of height centrally, age  indeterminate. No adjacent soft tissue edema to suggest acute fracture. Cervical vertebral body heights are maintained. The dens and skull base are intact. Diffuse bony under mineralization. Soft tissues and spinal canal: No prevertebral fluid or swelling. No visible canal hematoma. Disc levels: Endplate spurring with preservation of disc spaces. Scattered facet arthropathy, normal for age. Upper chest: Mild right apical pleuroparenchymal scarring. No acute abnormality. Other: Mild carotid calcifications. IMPRESSION: 1. No acute intracranial abnormality. Age related atrophy and chronic small vessel ischemia. 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 2. T1 compression fracture, technically age indeterminate but likely remote. No prior exams for comparison. No fracture or subluxation of the cervical spine. Electronically Signed   By: Rubye Oaks M.D.   On: 02/20/2017 20:19   Dg Knee Complete 4 Views Left  Result Date: 02/20/2017 CLINICAL DATA:  Recent trip and fall with left knee pain, initial encounter EXAM: LEFT KNEE - COMPLETE 4+ VIEW COMPARISON:  None. FINDINGS: There is a transverse fracture through the patella in the midbody without significant distraction of the fracture fragments. Joint effusion is seen. No other fracture is noted. IMPRESSION: Mid patellar fracture without fracture fragment distraction. Joint effusion is noted. Electronically Signed   By: Alcide Clever M.D.   On: 02/20/2017 19:59   Dg Knee Complete 4  Views Right  Result Date: 02/20/2017 CLINICAL DATA:  Recent trip and fall with right knee pain, initial encounter EXAM: RIGHT KNEE - COMPLETE 4+ VIEW COMPARISON:  None. FINDINGS: Mid patellar fracture is noted without significant fracture distraction. Large joint effusion is seen. No other focal abnormality is noted. IMPRESSION: Mid patellar fracture without significant distraction of the fracture fragments. Joint effusion  is noted. Electronically Signed   By: Alcide CleverMark  Lukens M.D.   On: 02/20/2017 20:00    EKG: Independently reviewed.  Assessment/Plan Active Problems:   Closed nondisplaced fracture of right patella, initial encounter   Closed nondisplaced fracture of left patella, initial encounter    1. B nondisplaced patellar fractures - 1. B knee immobilizers 2. PT/OT 3. SW consult 4. Anticipate she may need SNF  DVT prophylaxis: Lovenox Code Status: Full Family Communication: No family in room Disposition Plan: Likely SNF after admit Consults called: EDP called ortho Admission status: Place in obs   GARDNER, JARED M. DO [facility] Pager [phone]  If 7AM-7PM, please contact day team taking care of patient [email or web address] Password [email or web address]  02/20/2017, 10:14 PM

## 2017-02-21 DIAGNOSIS — S82002A Unspecified fracture of left patella, initial encounter for closed fracture: Secondary | ICD-10-CM | POA: Diagnosis not present

## 2017-02-21 DIAGNOSIS — S82001A Unspecified fracture of right patella, initial encounter for closed fracture: Secondary | ICD-10-CM | POA: Diagnosis not present

## 2017-02-21 MED ORDER — TRAMADOL HCL 50 MG PO TABS: 50.0000 mg | ORAL_TABLET | Freq: Four times a day (QID) | ORAL | 0 refills | Status: AC | PRN

## 2017-02-21 NOTE — Care Management Note (Signed)
Case Management Note  Patient Details  Name: Sheri RosebushBarbara Payne MRN: [id number] Date of Birth: 12/05/1925  Subjective/Objective: Patient to return back to ALF-[facility] w/HHPT/OT(facility provides their own HHPT/OT),needs hospital bed,& w/c-[facility] Brad [facility]aware of d/c.Per CSW can return back to [facility]F. Attending aware of d/c back to [facility] today w/dme & HHC-await dme orders, & narrative note,also d/c summary. Son Casimiro NeedleMichael [facility]-son also informed me of insurance-[employer]-I have spoken to admitting-Faye-provided her with tel# of son Lynnell GrainMichael-she will f/u on correct insurance. No further CM needs.                   Action/Plan:d/c ALF w/HHC/dme   Expected Discharge Date:   (unknown)               Expected Discharge Plan:  Assisted Living / Rest Home  In-House Referral:  Clinical Social Work  Discharge planning Services  CM Consult  Post Acute Care Choice:    Choice offered to:  Adult Children  DME Arranged:  Hospital bed, Wheelchair manual DME Agency:  [facility]  [facility]  [facility]  Other - See comment ([facility]-has their own)  Status of Service:  Completed, signed off  If discussed at Long Length of Stay Meetings, dates discussed:    Additional Comments:  Lanier ClamMahabir, Najla Aughenbaugh, RN 02/21/2017, 2:04 PM

## 2017-02-21 NOTE — Discharge Summary (Signed)
Physician Discharge Summary  Ivy Puryear RUE:[id number] DOB: 02/28/1926 DOA: 02/20/2017  PCP: System, Pcp Not In  Admit date: 02/20/2017 Discharge date: 02/21/2017  Admitted From: Assisted living Disposition:  Return to assisted living   Recommendations for Outpatient Follow-up:  Follow up with Orthopedics Dr.Olin in 2 weeks Recommend applying bilateral knee immobilizers at all times, weightbearing as tolerated, no ROM of knees.   Home Health: PT/OT Equipment/Devices: Wheelchair  Discharge Condition: Fair CODE STATUS: Full code Diet recommendation: Regular    Discharge Diagnoses:  Active Problems:   Closed nondisplaced fracture of right patella, initial encounter   Closed nondisplaced fracture of left patella, initial encounter  Brief narrative/history of present illness 83 year old female with history of glaucoma, mild to moderate dementia, resident of ALF memory care presented to the ED after sustaining a fall at the facility landed on her bilateral knees and bruised her chin. CT head and cervical spine without acute injury. She sustained bilateral nondisplaced patellar fractures. Bilateral knee nebulizers applied and placed on observation. Orthopedics consulted.  Hospital course   Principle Problem Closed nondisplaced fracture of bilateral patella Secondary to mechanical fall.  immobilizers at all times. Orthopedics consult appreciated. Okay to weight-bear as tolerated but no range of motion of either knee.  PT/OT can be arranged at her assisted living and patient will be discharged there. Prescribed tramadol as needed for pain. (Patient is not in pain was rested with the immobilizer) Follow-up with [facility] orthopedics (Dr. Charlann Boxer) in 2 weeks.  History of glaucoma Continue home eyedrops.     Code Status : Full code  Family Communication  : None at bedside  Disposition Plan  : Return to ALF   Barriers For Discharge : active symptoms   Consults  :   [facility]  Procedures  : none   Discharge Instructions   Allergies as of 02/21/2017      Reactions   Sulfa Antibiotics Rash   Other reaction(s): Intolerance      Medication List    TAKE these medications   aspirin EC 81 MG tablet Take 81 mg by mouth daily.   bimatoprost 0.03 % ophthalmic solution Commonly known as:  LUMIGAN Place 1 drop into the right eye at bedtime.   brimonidine 0.2 % ophthalmic solution Commonly known as:  ALPHAGAN Place 1 drop into the right eye 2 (two) times daily.   brinzolamide 1 % ophthalmic suspension Commonly known as:  AZOPT Place 1 drop into the right eye 2 (two) times daily.   diclofenac 0.1 % ophthalmic solution Commonly known as:  VOLTAREN Place 1 drop into the right eye every morning.   Melatonin 3 MG Caps Take 3 mg by mouth at bedtime as needed (sleep).   timolol 0.5 % ophthalmic solution Commonly known as:  TIMOPTIC Place 1 drop into the right eye 2 (two) times daily.   traMADol 50 MG tablet Commonly known as:  ULTRAM Take 1 tablet (50 mg total) by mouth every 6 (six) hours as needed for moderate pain.      Follow-up Information    Durene Romans, MD Follow up in 2 week(s).   Specialty:  Orthopedic Surgery Contact information: [address] [place] [address] [phone]        MD at ALF in 1 week Follow up in 1 week(s).          Allergies  Allergen Reactions  . Sulfa Antibiotics Rash    Other reaction(s): Intolerance        Procedures/Studies:  Dg Lumbar Spine Complete  Result Date: 02/20/2017 CLINICAL DATA:  Trip and fall with low back pain, initial encounter EXAM: LUMBAR SPINE - COMPLETE 4+ VIEW COMPARISON:  None. FINDINGS: Five lumbar type vertebral bodies are well visualized. Mild osteopenia is noted. Compression deformities are noted at T10, T12 and L1. They have a more chronic appearance although an acute component cannot be excluded on the basis of this exam. Mild  degenerative changes in the facet joints are seen. No anterolisthesis is noted. No other focal abnormality is noted. IMPRESSION: T10, T12 and L1 and compression deformities likely of a chronic nature. Correlation to point tenderness is recommended. CT may be helpful for further evaluation. Electronically Signed   By: Alcide CleverMark  Lukens M.D.   On: 02/20/2017 19:58   Ct Head Wo Contrast  Result Date: 02/20/2017 CLINICAL DATA:  Trip and fall. EXAM: CT HEAD WITHOUT CONTRAST CT CERVICAL SPINE WITHOUT CONTRAST TECHNIQUE: Multidetector CT imaging of the head and cervical spine was performed following the standard protocol without intravenous contrast. Multiplanar CT image reconstructions of the cervical spine were also generated. COMPARISON:  Head CT in 04/2021 10 FINDINGS: CT HEAD FINDINGS Brain: No acute hemorrhage. No evidence of acute ischemia. Generalized atrophy and chronic small vessel ischemia, with mild progression from prior exam. There is minimal encephalomalacia in the right temporal occipital lobe consistent with remote infarct. No subdural or extra-axial fluid collection. No hydrocephalus, the basilar cisterns are patent. Gray-white differentiation is preserved. Vascular: Atherosclerosis of skullbase vasculature without hyperdense vessel or abnormal calcification. Skull: There is diffuse bony demineralization. No skull fracture. No focal lesion. Sinuses/Orbits: Paranasal sinuses and mastoid air cells are clear. The visualized orbits are unremarkable. Probable bilateral cataract resection. Other: None. CT CERVICAL SPINE FINDINGS Alignment: Normal. Skull base and vertebrae: Mild compression fracture of T1 with approximately 25% loss of height centrally, age indeterminate. No adjacent soft tissue edema to suggest acute fracture. Cervical vertebral body heights are maintained. The dens and skull base are intact. Diffuse bony under mineralization. Soft tissues and spinal canal: No prevertebral fluid or swelling. No  visible canal hematoma. Disc levels: Endplate spurring with preservation of disc spaces. Scattered facet arthropathy, normal for age. Upper chest: Mild right apical pleuroparenchymal scarring. No acute abnormality. Other: Mild carotid calcifications. IMPRESSION: 1. No acute intracranial abnormality. Age related atrophy and chronic small vessel ischemia. 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 2. T1 compression fracture, technically age indeterminate but likely remote. No prior exams for comparison. No fracture or subluxation of the cervical spine. Electronically Signed   By: Rubye OaksMelanie  Ehinger M.D.   On: 02/20/2017 20:19   Ct Cervical Spine Wo Contrast  Result Date: 02/20/2017 CLINICAL DATA:  Trip and fall. EXAM: CT HEAD WITHOUT CONTRAST CT CERVICAL SPINE WITHOUT CONTRAST TECHNIQUE: Multidetector CT imaging of the head and cervical spine was performed following the standard protocol without intravenous contrast. Multiplanar CT image reconstructions of the cervical spine were also generated. COMPARISON:  Head CT in 04/2021 10 FINDINGS: CT HEAD FINDINGS Brain: No acute hemorrhage. No evidence of acute ischemia. Generalized atrophy and chronic small vessel ischemia, with mild progression from prior exam. There is minimal encephalomalacia in the right temporal occipital lobe consistent with remote infarct. No subdural or extra-axial fluid collection. No hydrocephalus, the basilar cisterns are patent. Gray-white differentiation is preserved. Vascular: Atherosclerosis of skullbase vasculature without hyperdense vessel or abnormal calcification. Skull: There is diffuse bony demineralization. No skull fracture. No focal lesion. Sinuses/Orbits:  Paranasal sinuses and mastoid air cells are clear. The visualized orbits are unremarkable. Probable bilateral cataract resection. Other: None. CT CERVICAL SPINE FINDINGS Alignment: Normal. Skull base and vertebrae:  Mild compression fracture of T1 with approximately 25% loss of height centrally, age indeterminate. No adjacent soft tissue edema to suggest acute fracture. Cervical vertebral body heights are maintained. The dens and skull base are intact. Diffuse bony under mineralization. Soft tissues and spinal canal: No prevertebral fluid or swelling. No visible canal hematoma. Disc levels: Endplate spurring with preservation of disc spaces. Scattered facet arthropathy, normal for age. Upper chest: Mild right apical pleuroparenchymal scarring. No acute abnormality. Other: Mild carotid calcifications. IMPRESSION: 1. No acute intracranial abnormality. Age related atrophy and chronic small vessel ischemia. 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 2. T1 compression fracture, technically age indeterminate but likely remote. No prior exams for comparison. No fracture or subluxation of the cervical spine. Electronically Signed   By: Rubye OaksMelanie  Ehinger M.D.   On: 02/20/2017 20:19   Dg Knee Complete 4 Views Left  Result Date: 02/20/2017 CLINICAL DATA:  Recent trip and fall with left knee pain, initial encounter EXAM: LEFT KNEE - COMPLETE 4+ VIEW COMPARISON:  None. FINDINGS: There is a transverse fracture through the patella in the midbody without significant distraction of the fracture fragments. Joint effusion is seen. No other fracture is noted. IMPRESSION: Mid patellar fracture without fracture fragment distraction. Joint effusion is noted. Electronically Signed   By: Alcide CleverMark  Lukens M.D.   On: 02/20/2017 19:59   Dg Knee Complete 4 Views Right  Result Date: 02/20/2017 CLINICAL DATA:  Recent trip and fall with right knee pain, initial encounter EXAM: RIGHT KNEE - COMPLETE 4+ VIEW COMPARISON:  None. FINDINGS: Mid patellar fracture is noted without significant fracture distraction. Large joint effusion is seen. No other focal abnormality is noted. IMPRESSION: Mid  patellar fracture without significant distraction of the fracture fragments. Joint effusion is noted. Electronically Signed   By: Alcide CleverMark  Lukens M.D.   On: 02/20/2017 20:00       Subjective: Denies any pain in her knees.  Discharge Exam: Vitals:   02/20/17 2354 02/21/17 0504  BP: (!) 149/72 (!) 123/59  Pulse:  63  Resp: 18 18  Temp: 98.1 F (36.7 C) 98.8 F (37.1 C)   Vitals:   02/20/17 1741 02/20/17 2145 02/20/17 2354 02/21/17 0504  BP: (!) 163/83 129/73 (!) 149/72 (!) 123/59  Pulse: 64 78  63  Resp: 18 18 18 18   Temp: 97.7 F (36.5 C)  98.1 F (36.7 C) 98.8 F (37.1 C)  TempSrc: Oral  Oral Oral  SpO2: 96% 99% 96% 95%  Weight:   57.3 kg (126 lb 5.2 oz)   Height:   5\' 5"  (1.651 m)     Gen: not in distress HEENT: Bruising of the chin, no pallor, moist mucosa, supple neck Chest: clear b/l, no added sounds CVS: N S1&S2, no murmurs, GI: soft, NT, ND,  Musculoskeletal: warm, b/l knee immobilizer CNS: AAOX2, non focal   The results of significant diagnostics from this hospitalization (including imaging, microbiology, ancillary and laboratory) are listed below for reference.     Microbiology: No results found for this or any previous visit (from the past 240 hour(s)).   Labs: BNP (last 3 results) No results for input(s): BNP in the last 8760 hours. Basic Metabolic Panel:  Recent Labs Lab 02/20/17 2050  NA 135  K 4.1  CL  105  CO2 22  GLUCOSE 154*  BUN 11  CREATININE 0.78  CALCIUM 8.6*   Liver Function Tests:  Recent Labs Lab 02/20/17 2050  AST 26  ALT 18  ALKPHOS 60  BILITOT 0.8  PROT 7.1  ALBUMIN 3.5   No results for input(s): LIPASE, AMYLASE in the last 168 hours. No results for input(s): AMMONIA in the last 168 hours. CBC:  Recent Labs Lab 02/20/17 2050  WBC 12.1*  NEUTROABS 9.1*  HGB 13.6  HCT 39.2  MCV 91.6  PLT 208   Cardiac Enzymes: No results for input(s): CKTOTAL, CKMB, CKMBINDEX, TROPONINI in the last 168  hours. BNP: Invalid input(s): POCBNP CBG: No results for input(s): GLUCAP in the last 168 hours. D-Dimer No results for input(s): DDIMER in the last 72 hours. Hgb A1c No results for input(s): HGBA1C in the last 72 hours. Lipid Profile No results for input(s): CHOL, HDL, LDLCALC, TRIG, CHOLHDL, LDLDIRECT in the last 72 hours. Thyroid function studies No results for input(s): TSH, T4TOTAL, T3FREE, THYROIDAB in the last 72 hours.  Invalid input(s): FREET3 Anemia work up No results for input(s): VITAMINB12, FOLATE, FERRITIN, TIBC, IRON, RETICCTPCT in the last 72 hours. Urinalysis No results found for: COLORURINE, APPEARANCEUR, LABSPEC, PHURINE, GLUCOSEU, HGBUR, BILIRUBINUR, KETONESUR, PROTEINUR, UROBILINOGEN, NITRITE, LEUKOCYTESUR Sepsis Labs Invalid input(s): PROCALCITONIN,  WBC,  LACTICIDVEN Microbiology No results found for this or any previous visit (from the past 240 hour(s)).   Time coordinating discharge: <30 minutes  SIGNED:   Eddie North, MD  [facility] 02/21/2017, 2:08 PM Pager   If 7PM-7AM, please contact night-coverage [email or web address] Password [email or web address]

## 2017-02-21 NOTE — Consult Note (Signed)
Reason for Consult:  Bilateral non-displaced patella fractures Referring Physician: Gareth Morgan, MD  Sheri Payne is an 83 y.o. female.  HPI: Sheri Payne is a 83 y.o. female who presented to the ED yesterday (02/20/2017) following a fall at her facility.  She lives at an assisted living facility.  She was leaving her room we she tripped and fell, landing on both knees and chin.  In addition to bruising her chin, she has bilateral patellar fractures that are non-displaced.  She is in no pain as long as she isnt moving her legs. Dr. Alvan Dame was consulted and reviewed the patient's findings.    Past Medical History:  Diagnosis Date  . Glaucoma     Past Surgical History:  Procedure Laterality Date  . APPENDECTOMY    . EYE SURGERY Right    pt unsure what type of eye surgery but states in right eye    History reviewed. No pertinent family history.  Social History:  reports that she quit smoking about 72 years ago. Her smoking use included Cigarettes. She has never used smokeless tobacco. She reports that she drinks alcohol. She reports that she does not use drugs.  Allergies:  Allergies  Allergen Reactions  . Sulfa Antibiotics Rash    Other reaction(s): Intolerance     Results for orders placed or performed during the hospital encounter of 02/20/17 (from the past 48 hour(s))  CBC with Differential     Status: Abnormal   Collection Time: 02/20/17  8:50 PM  Result Value Ref Range   WBC 12.1 (H) 4.0 - 10.5 K/uL   RBC 4.28 3.87 - 5.11 MIL/uL   Hemoglobin 13.6 12.0 - 15.0 g/dL   HCT 39.2 36.0 - 46.0 %   MCV 91.6 78.0 - 100.0 fL   MCH 31.8 26.0 - 34.0 pg   MCHC 34.7 30.0 - 36.0 g/dL   RDW 13.0 11.5 - 15.5 %   Platelets 208 150 - 400 K/uL   Neutrophils Relative % 75 %   Lymphocytes Relative 15 %   Monocytes Relative 9 %   Eosinophils Relative 1 %   Basophils Relative 0 %   Neutro Abs 9.1 (H) 1.7 - 7.7 K/uL   Lymphs Abs 1.8 0.7 - 4.0 K/uL   Monocytes Absolute 1.1 (H) 0.1 -  1.0 K/uL   Eosinophils Absolute 0.1 0.0 - 0.7 K/uL   Basophils Absolute 0.0 0.0 - 0.1 K/uL   Smear Review MORPHOLOGY UNREMARKABLE   Comprehensive metabolic panel     Status: Abnormal   Collection Time: 02/20/17  8:50 PM  Result Value Ref Range   Sodium 135 135 - 145 mmol/L   Potassium 4.1 3.5 - 5.1 mmol/L   Chloride 105 101 - 111 mmol/L   CO2 22 22 - 32 mmol/L   Glucose, Bld 154 (H) 65 - 99 mg/dL   BUN 11 6 - 20 mg/dL   Creatinine, Ser 0.78 0.44 - 1.00 mg/dL   Calcium 8.6 (L) 8.9 - 10.3 mg/dL   Total Protein 7.1 6.5 - 8.1 g/dL   Albumin 3.5 3.5 - 5.0 g/dL   AST 26 15 - 41 U/L   ALT 18 14 - 54 U/L   Alkaline Phosphatase 60 38 - 126 U/L   Total Bilirubin 0.8 0.3 - 1.2 mg/dL   GFR calc non Af Amer >60 >60 mL/min   GFR calc Af Amer >60 >60 mL/min    Comment: (NOTE) The eGFR has been calculated using the CKD EPI equation. This calculation  has not been validated in all clinical situations. eGFR's persistently <60 mL/min signify possible Chronic Kidney Disease.    Anion gap 8 5 - 15    Dg Lumbar Spine Complete  Result Date: 02/20/2017 CLINICAL DATA:  Trip and fall with low back pain, initial encounter EXAM: LUMBAR SPINE - COMPLETE 4+ VIEW COMPARISON:  None. FINDINGS: Five lumbar type vertebral bodies are well visualized. Mild osteopenia is noted. Compression deformities are noted at T10, T12 and L1. They have a more chronic appearance although an acute component cannot be excluded on the basis of this exam. Mild degenerative changes in the facet joints are seen. No anterolisthesis is noted. No other focal abnormality is noted. IMPRESSION: T10, T12 and L1 and compression deformities likely of a chronic nature. Correlation to point tenderness is recommended. CT may be helpful for further evaluation. Electronically Signed   By: Inez Catalina M.D.   On: 02/20/2017 19:58   Ct Head Wo Contrast  Result Date: 02/20/2017 CLINICAL DATA:  Trip and fall. EXAM: CT HEAD WITHOUT CONTRAST CT CERVICAL  SPINE WITHOUT CONTRAST TECHNIQUE: Multidetector CT imaging of the head and cervical spine was performed following the standard protocol without intravenous contrast. Multiplanar CT image reconstructions of the cervical spine were also generated. COMPARISON:  Head CT in 04/2021 10 FINDINGS: CT HEAD FINDINGS Brain: No acute hemorrhage. No evidence of acute ischemia. Generalized atrophy and chronic small vessel ischemia, with mild progression from prior exam. There is minimal encephalomalacia in the right temporal occipital lobe consistent with remote infarct. No subdural or extra-axial fluid collection. No hydrocephalus, the basilar cisterns are patent. Gray-white differentiation is preserved. Vascular: Atherosclerosis of skullbase vasculature without hyperdense vessel or abnormal calcification. Skull: There is diffuse bony demineralization. No skull fracture. No focal lesion. Sinuses/Orbits: Paranasal sinuses and mastoid air cells are clear. The visualized orbits are unremarkable. Probable bilateral cataract resection. Other: None. CT CERVICAL SPINE FINDINGS Alignment: Normal. Skull base and vertebrae: Mild compression fracture of T1 with approximately 25% loss of height centrally, age indeterminate. No adjacent soft tissue edema to suggest acute fracture. Cervical vertebral body heights are maintained. The dens and skull base are intact. Diffuse bony under mineralization. Soft tissues and spinal canal: No prevertebral fluid or swelling. No visible canal hematoma. Disc levels: Endplate spurring with preservation of disc spaces. Scattered facet arthropathy, normal for age. Upper chest: Mild right apical pleuroparenchymal scarring. No acute abnormality. Other: Mild carotid calcifications. IMPRESSION: 1. No acute intracranial abnormality. Age related atrophy and chronic small vessel ischemia. 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 2. T1 compression  fracture, technically age indeterminate but likely remote. No prior exams for comparison. No fracture or subluxation of the cervical spine. Electronically Signed   By: Jeb Levering M.D.   On: 02/20/2017 20:19   Ct Cervical Spine Wo Contrast  Result Date: 02/20/2017 CLINICAL DATA:  Trip and fall. EXAM: CT HEAD WITHOUT CONTRAST CT CERVICAL SPINE WITHOUT CONTRAST TECHNIQUE: Multidetector CT imaging of the head and cervical spine was performed following the standard protocol without intravenous contrast. Multiplanar CT image reconstructions of the cervical spine were also generated. COMPARISON:  Head CT in 04/2021 10 FINDINGS: CT HEAD FINDINGS Brain: No acute hemorrhage. No evidence of acute ischemia. Generalized atrophy and chronic small vessel ischemia, with mild progression from prior exam. There is minimal encephalomalacia in the right temporal occipital lobe consistent with remote infarct. No subdural or extra-axial fluid collection. No hydrocephalus, the basilar cisterns are  patent. Gray-white differentiation is preserved. Vascular: Atherosclerosis of skullbase vasculature without hyperdense vessel or abnormal calcification. Skull: There is diffuse bony demineralization. No skull fracture. No focal lesion. Sinuses/Orbits: Paranasal sinuses and mastoid air cells are clear. The visualized orbits are unremarkable. Probable bilateral cataract resection. Other: None. CT CERVICAL SPINE FINDINGS Alignment: Normal. Skull base and vertebrae: Mild compression fracture of T1 with approximately 25% loss of height centrally, age indeterminate. No adjacent soft tissue edema to suggest acute fracture. Cervical vertebral body heights are maintained. The dens and skull base are intact. Diffuse bony under mineralization. Soft tissues and spinal canal: No prevertebral fluid or swelling. No visible canal hematoma. Disc levels: Endplate spurring with preservation of disc spaces. Scattered facet arthropathy, normal for age.  Upper chest: Mild right apical pleuroparenchymal scarring. No acute abnormality. Other: Mild carotid calcifications. IMPRESSION: 1. No acute intracranial abnormality. Age related atrophy and chronic small vessel ischemia. 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 2. T1 compression fracture, technically age indeterminate but likely remote. No prior exams for comparison. No fracture or subluxation of the cervical spine. Electronically Signed   By: Jeb Levering M.D.   On: 02/20/2017 20:19   Dg Knee Complete 4 Views Left  Result Date: 02/20/2017 CLINICAL DATA:  Recent trip and fall with left knee pain, initial encounter EXAM: LEFT KNEE - COMPLETE 4+ VIEW COMPARISON:  None. FINDINGS: There is a transverse fracture through the patella in the midbody without significant distraction of the fracture fragments. Joint effusion is seen. No other fracture is noted. IMPRESSION: Mid patellar fracture without fracture fragment distraction. Joint effusion is noted. Electronically Signed   By: Inez Catalina M.D.   On: 02/20/2017 19:59   Dg Knee Complete 4 Views Right  Result Date: 02/20/2017 CLINICAL DATA:  Recent trip and fall with right knee pain, initial encounter EXAM: RIGHT KNEE - COMPLETE 4+ VIEW COMPARISON:  None. FINDINGS: Mid patellar fracture is noted without significant fracture distraction. Large joint effusion is seen. No other focal abnormality is noted. IMPRESSION: Mid patellar fracture without significant distraction of the fracture fragments. Joint effusion is noted. Electronically Signed   By: Inez Catalina M.D.   On: 02/20/2017 20:00    Review of Systems  Constitutional: Negative.   HENT: Negative.   Eyes: Negative.   Respiratory: Negative.   Cardiovascular: Negative.   Gastrointestinal: Negative.   Genitourinary: Negative.   Musculoskeletal: Positive for back pain and joint pain (bilateral knee).  Skin: Negative.   Neurological:  Negative.   Endo/Heme/Allergies: Negative.   Psychiatric/Behavioral: Negative.     Blood pressure (!) 123/59, pulse 63, temperature 98.8 F (37.1 C), temperature source Oral, resp. rate 18, height 5' 5"  (1.651 m), weight 57.3 kg (126 lb 5.2 oz), SpO2 95 %. Physical Exam  Constitutional: She is oriented to person, place, and time. She appears well-developed.  HENT:  Head: Normocephalic.  Eyes: Pupils are equal, round, and reactive to light.  Neck: Neck supple. No JVD present. No tracheal deviation present. No thyromegaly present.  Cardiovascular: Normal rate, regular rhythm and intact distal pulses.   Respiratory: Effort normal and breath sounds normal. No respiratory distress. She has no wheezes.  GI: Soft. There is no tenderness. There is no guarding.  Musculoskeletal:       Right knee: She exhibits decreased range of motion, swelling and bony tenderness. She exhibits no ecchymosis, no deformity, no laceration and no erythema. Tenderness found.  Left knee: She exhibits decreased range of motion, swelling and bony tenderness. She exhibits no ecchymosis, no deformity, no laceration and no erythema. Tenderness found.  Lymphadenopathy:    She has no cervical adenopathy.  Neurological: She is alert and oriented to person, place, and time.  Skin: Skin is warm and dry.  Psychiatric: She has a normal mood and affect.     Assessment/Plan:  Bilateral non-displaced patella fractures    At this time the patient is going to be treated non-operatively.  She will maintain bilateral knee immobilizers at all times.  She may be WBAT, however NO ROM of either knee.  Gentle pain control as she is not in pain if she's not moving her knees.  PT may work with the patient, but again NO ROM of either knee.  Follow up in 2 weeks at [facility].  Follow up with OLIN,Glade Strausser D in 2 weeks.  Contact information:  [facility] [address], [address]  [place] [phone]         Sheri Payne 02/21/2017, 8:48 AM

## 2017-02-21 NOTE — Evaluation (Signed)
Physical Therapy Evaluation Patient Details Name: Sheri RosebushBarbara Payne MRN: [id number] DOB: 08/18/1925 Today's Date: 02/21/2017   History of Present Illness  Pt fell and sustained bil patella fxs.  She has a PMH of glaucoma, ? mild dementia, and per CT's a remote small R temporal/occipital lobe infarct and T1 compression fx.  Clinical Impression  The patient is requiring 2 assist for mobility and attempts to stand from raised bed. Patient reports back pain more limiting. Pt admitted with above diagnosis. Pt currently with functional limitations due to the deficits listed below (see PT Problem List). Pt will benefit from skilled PT to increase their independence and safety with mobility to allow discharge to the venue listed below.       Follow Up Recommendations SNF;Home health PT;Supervision/Assistance - 24 hour. From ALF    Equipment Recommendations  Rolling walker with 5" wheels    Recommendations for Other Services       Precautions / Restrictions Precautions Precautions: Fall Precaution Comments: KI on at all times BIL Restrictions RLE Weight Bearing: Weight bearing as tolerated LLE Weight Bearing: Weight bearing as tolerated Other Position/Activity Restrictions: with bil KIs      Mobility  Bed Mobility Overal bed mobility: Needs Assistance Bed Mobility: Rolling;Supine to Sit;Sit to Sidelying Rolling: Min assist   Supine to sit: Max assist;+2 for physical assistance   Sit to sidelying: Max assist;+2 for physical assistance General bed mobility comments: pt c/o low back pain after sitting up; returned to supine via sidelying  Transfers Overall transfer level: Needs assistance Equipment used: Rolling walker (2 wheeled) Transfers: Sit to/from Stand Sit to Stand: Max assist;+2 physical assistance         General transfer comment:  Bed raised to near standing, required 2 assist [facility] with assist to rise and stabilize.  Pt stood only a few seconds before  initiating sitting back onto bed  Ambulation/Gait                Stairs            Wheelchair Mobility    Modified Rankin (Stroke Patients Only)       Balance Overall balance assessment: History of Falls                                           Pertinent Vitals/Pain Pain Assessment: Faces Faces Pain Scale: Hurts even more Pain Location: back Pain Descriptors / Indicators: Grimacing Pain Intervention(s): Monitored during session;Premedicated before session;Limited activity within patient's tolerance    Home Living Family/patient expects to be discharged to:: Unsure                 Additional Comments: from [facility]F    Prior Function           Comments: unknown; pt is a poor historian     Hand Dominance        Extremity/Trunk Assessment   Upper Extremity Assessment Upper Extremity Assessment: Defer to OT evaluation    Lower Extremity Assessment Lower Extremity Assessment: RLE deficits/detail;LLE deficits/detail RLE Deficits / Details: able to raise leg from bed, did bear weight for few seconds standing. LLE Deficits / Details: same as right but reports more pain    Cervical / Trunk Assessment Cervical / Trunk Assessment: Normal  Communication   Communication: No difficulties  Cognition Arousal/Alertness: Awake/alert Behavior During Therapy: [facility]  for tasks assessed/performed Overall Cognitive Status: No family/caregiver present to determine baseline cognitive functioning                                 General Comments: oriented to self and situation.  Pt lacks insight and says she just wants to go home and take care of self; decreased safety, states that she should not have called for help when fell and just gotten up and gone on      General Comments      Exercises     Assessment/Plan    PT Assessment Patient needs continued PT services  PT Problem List Decreased strength;Decreased  range of motion;Decreased activity tolerance;Decreased balance;Decreased mobility;Decreased cognition;Decreased knowledge of use of DME;Decreased safety awareness;Decreased knowledge of precautions;Pain       PT Treatment Interventions DME instruction;Gait training;Functional mobility training;Therapeutic activities;Patient/family education    PT Goals (Current goals can be found in the Care Plan section)  Acute Rehab PT Goals Patient Stated Goal: go home PT Goal Formulation: Patient unable to participate in goal setting Time For Goal Achievement: 03/07/17 Potential to Achieve Goals: Fair    Frequency Min 2X/week   Barriers to discharge Decreased caregiver support      Co-evaluation PT/OT/SLP Co-Evaluation/Treatment: Yes Reason for Co-Treatment: For patient/therapist safety PT goals addressed during session: Mobility/safety with mobility OT goals addressed during session: ADL's and self-care       AM-PAC PT "6 Clicks" Daily Activity  Outcome Measure Difficulty turning over in bed (including adjusting bedclothes, sheets and blankets)?: Total Difficulty moving from lying on back to sitting on the side of the bed? : Total Difficulty sitting down on and standing up from a chair with arms (e.g., wheelchair, bedside commode, etc,.)?: Total Help needed moving to and from a bed to chair (including a wheelchair)?: Total Help needed walking in hospital room?: Total Help needed climbing 3-5 steps with a railing? : Total 6 Click Score: 6    End of Session Equipment Utilized During Treatment: Gait belt Activity Tolerance: Patient limited by pain Patient left: in bed;with call bell/phone within reach;with bed alarm set Nurse Communication: Mobility status PT Visit Diagnosis: Difficulty in walking, not elsewhere classified (R26.2);History of falling (Z91.81)    Time: [id number] PT Time Calculation (min) (ACUTE ONLY): 17 min   Charges:   PT Evaluation $PT Eval Low Complexity: 1  Procedure     PT G Codes:   PT G-Codes **NOT FOR INPATIENT CLASS** Functional Assessment Tool Used: Clinical judgement Functional Limitation: Mobility: Walking and moving around Mobility: Walking and Moving Around Current Status (B1478(G8978): 100 percent impaired, limited or restricted Mobility: Walking and Moving Around Goal Status (G9562(G8979): At least 20 percent but less than 40 percent impaired, limited or restricted    [facility] NorthKaren Genaro Payne PT [phone]  Rada HayHill, Dametri Ozburn Elizabeth 02/21/2017, 11:55 AM

## 2017-02-21 NOTE — Progress Notes (Signed)
[facility]. Pt / son are in agreement with dc plan. PTAR transport is required. Medical necessity form completed. DC Summary / FL2 has been sent to facility for review. Script included in dc packet. # for report provided to nsg. [facility]. Pt's nsg will contact EMS for transportation to facility.   Cori RazorJamie Aland Chestnutt LCSW [phone]

## 2017-02-21 NOTE — Progress Notes (Signed)
CSW assisting with dc planning. [facility]. Bradly BienenstockAlma Love, from memory care unit, will call once she has reviewed  info and has decided if pt's care can be managed at their facility.  Cori RazorJamie Aashi Derrington LCSW [phone]

## 2017-02-21 NOTE — Progress Notes (Signed)
CSW assisting with dc planning. [facility] memory care contacted and Ms. Love reports that they can manage pt's care at their facility. RNCM has spoken with pt's son and he has been updated and in agreement with dc plan. Hospital bed, w/c, PT, OT will be needed at facility. RNCM is assisting CSW with dc planning.   Cori RazorJamie Patryk Conant LCSW [phone]

## 2017-02-21 NOTE — Clinical Social Work Note (Signed)
Clinical Social Work Assessment  Patient Details  Name: Sheri Payne MRN: [id number] Date of Birth: 1926-03-05  Date of referral:  02/21/17               Reason for consult:  Facility Placement, Discharge Planning                Permission sought to share information with:  Facility Art therapist granted to share information::  Yes, Verbal Permission Granted  Name::        Agency::     Relationship::     Contact Information:     Housing/Transportation Living arrangements for the past 2 months:   (Memory Care Unit) Source of Information:  Facility, Patient Patient Interpreter Needed:  None Criminal Activity/Legal Involvement Pertinent to Current Situation/Hospitalization:  No - Comment as needed Significant Relationships:  Adult Children Lives with:  Facility Resident Do you feel safe going back to the place where you live?  Yes Need for family participation in patient care:  Yes (Comment)  Care giving concerns:  No family at bedside. Pt voices no concerns.   Social Worker assessment / plan:  Pt hospitalized on 02/20/17, under observation status, from [facility] memory care unit with Bilateral non-displaced patella fractures. CSW met with pt at bedside to assist with dc planning. Surgery is not required. PT / OT evals are pending. Pt is hoping to return to [facility] at dc. CSW spoke with Fulton Mole from [facility] and clinicals sent for review.  PT / OT evals will be sent to Ms. Love once available. [facility] is willing to re admit pt if needs can be met. Pt gave CSW permission to contact her son to include him in dc planning. Pt's son would like pt to return to [facility] at [employer]. CSW will continue to follow to assist with d/c planning needs.  Employment status:  Retired Nurse, adult PT Recommendations:  Not assessed at this time (PT / OT pending) Information / Referral to community resources:     Patient/Family's  Response to care:  Pt would like to return to [facility].  Patient/Family's Understanding of and Emotional Response to Diagnosis, Current Treatment, and Prognosis: " I don't hurt unless I move. " Pt is aware of her medical status. Pt is not looking forward to working with PT today. Support / encouragement provided. Pt's son has spoken with ED MD for medical up.  Emotional Assessment Appearance:  Appears stated age Attitude/Demeanor/Rapport:  Other (cooperative) Affect (typically observed):  Calm, Appropriate Orientation:  Oriented to Self, Oriented to Place, Oriented to Situation Alcohol / Substance use:  Not Applicable Psych involvement (Current and /or in the community):  No (Comment)  Discharge Needs  Concerns to be addressed:  Discharge Planning Concerns Readmission within the last 30 days:  No Current discharge risk:  None Barriers to Discharge:  No Barriers Identified   Luretha Rued, [place] 02/21/2017, 10:22 AM

## 2017-02-21 NOTE — Care Management Note (Signed)
Case Management Note  Patient Details  Name: Glory RosebushBarbara Mizrachi MRN: [id number] Date of Birth: 09/14/1925  Subjective/Objective:  83 y/o f admitted w/Closed non displaced fx r patella. From [facility] memory care. OT recc SNF. Await PT recc. CSW already following.                  Action/Plan:d/c plan ALF   Expected Discharge Date:   (unknown)               Expected Discharge Plan:  Assisted Living / Rest Home  In-House Referral:  Clinical Social Work  Discharge planning Services  CM Consult  Post Acute Care Choice:    Choice offered to:     DME Arranged:    DME Agency:     HH Arranged:    HH Agency:     Status of Service:  In process, will continue to follow  If discussed at Long Length of Stay Meetings, dates discussed:    Additional Comments:  Lanier ClamMahabir, Layani Foronda, RN 02/21/2017, 11:57 AM

## 2017-02-21 NOTE — Evaluation (Signed)
Occupational Therapy Evaluation Patient Details Name: Sheri RosebushBarbara Cassetta MRN: [id number] DOB: 05/29/1926 Today's Date: 02/21/2017    History of Present Illness Pt fell and sustained bil patella fxs.  She has a PMH of glaucoma, ? mild dementia, and per CT's a remote small R temporal/occipital lobe infarct and T1 compression fx.   Clinical Impression   This 83 year old female was admitted for the above. Unsure if she had assistance for adls at ALF; she states that she did quite a bit herself.  She currently needs up to total A for adls.  Most are performed from bed level, although she tolerated sitting EOB and stood briefly, but standing was not functional for adls at this time. Goals are for mod +2 assistance.    Follow Up Recommendations  SNF (unless ALF can manage her at this level)    Equipment Recommendations   (defer to next venue)    Recommendations for Other Services       Precautions / Restrictions Precautions Precautions: Fall Precaution Comments: KI on at all times BIL Restrictions RLE Weight Bearing: Weight bearing as tolerated LLE Weight Bearing: Weight bearing as tolerated Other Position/Activity Restrictions: with bil KIs      Mobility Bed Mobility Overal bed mobility: Needs Assistance Bed Mobility: Rolling;Supine to Sit;Sit to Sidelying Rolling: Min assist   Supine to sit: Max assist;+2 for physical assistance   Sit to sidelying: Max assist;+2 for physical assistance General bed mobility comments: pt c/o low back pain after sitting up; returned to supine via sidelying  Transfers Overall transfer level: Needs assistance Equipment used: Rolling walker (2 wheeled) Transfers: Sit to/from Stand Sit to Stand: Max assist;+2 physical assistance         General transfer comment: assist to rise and stabilize.  Pt stood only a few seconds before initiating sitting back onto bed    Balance Overall balance assessment: History of Falls                                         ADL either performed or assessed with clinical judgement   ADL Overall ADL's : Needs assistance/impaired Eating/Feeding: Set up;Sitting;Bed level   Grooming: Set up;Bed level   Upper Body Bathing: Set up;Bed level   Lower Body Bathing: Maximal assistance;Bed level   Upper Body Dressing : Minimal assistance;Bed level   Lower Body Dressing: Total assistance;Bed level   Toilet Transfer: Total assistance (bedpan)   Toileting- Clothing Manipulation and Hygiene: Total assistance;Bed level         General ADL Comments: pt is able to roll with min A for adls. She sat EOB and stood briefly, but sat down quickly.     Vision         Perception     Praxis      Pertinent Vitals/Pain Pain Assessment: Faces Faces Pain Scale: Hurts even more Pain Location: back Pain Descriptors / Indicators: Grimacing Pain Intervention(s): Limited activity within patient's tolerance;Monitored during session;Premedicated before session;Repositioned     Hand Dominance     Extremity/Trunk Assessment Upper Extremity Assessment Upper Extremity Assessment: Generalized weakness           Communication Communication Communication: No difficulties   Cognition Arousal/Alertness: Awake/alert Behavior During Therapy: WFL for tasks assessed/performed Overall Cognitive Status: No family/caregiver present to determine baseline cognitive functioning  General Comments: oriented to self and situation.  Pt lacks insight and says she just wants to go home and take care of self; decreased safety   General Comments       Exercises     Shoulder Instructions      Home Living Family/patient expects to be discharged to:: Unsure                                 Additional Comments: from [facility]      Prior Functioning/Environment          Comments: unknown; pt is a poor historian        OT  Problem List: Decreased strength;Decreased activity tolerance;Impaired balance (sitting and/or standing);Decreased cognition;Decreased safety awareness;Decreased knowledge of use of DME or AE;Decreased knowledge of precautions;Pain      OT Treatment/Interventions: Self-care/ADL training;Therapeutic exercise;Therapeutic activities;DME and/or AE instruction;Balance training;Patient/family education;Cognitive remediation/compensation    OT Goals(Current goals can be found in the care plan section) Acute Rehab OT Goals Patient Stated Goal: go home OT Goal Formulation: Patient unable to participate in goal setting Time For Goal Achievement: 02/28/17 Potential to Achieve Goals: Good ADL Goals Pt Will Transfer to Toilet: with mod assist;with +2 assist;bedside commode;stand pivot transfer Additional ADL Goal #1: pt will perform bed mobility with mod A in preparation for toilet transfers Additional ADL Goal #2: pt will go from sit to stand with mod +2 assist and maintain for 2 minutes for adls Additional ADL Goal #3: Pt will sit EOB x 10 minutes and perform level one theraband with min guard assist and cues  OT Frequency: Min 2X/week   Barriers to D/C:            Co-evaluation              AM-PAC PT "6 Clicks" Daily Activity     Outcome Measure Help from another person eating meals?: A Little Help from another person taking care of personal grooming?: A Little Help from another person toileting, which includes using toliet, bedpan, or urinal?: Total Help from another person bathing (including washing, rinsing, drying)?: A Lot Help from another person to put on and taking off regular upper body clothing?: None Help from another person to put on and taking off regular lower body clothing?: Total 6 Click Score: 14   End of Session    Activity Tolerance: Patient limited by pain Patient left: in bed;with call bell/phone within reach;with bed alarm set  OT Visit Diagnosis: Unsteadiness  on feet (R26.81);Muscle weakness (generalized) (M62.81);History of falling (Z91.81)                Time: 1308-65781055-1112 OT Time Calculation (min): 17 min Charges:  OT General Charges $OT Visit: 1 Procedure OT Evaluation $OT Eval Low Complexity: 1 Procedure G-Codes: OT G-codes **NOT FOR INPATIENT CLASS** Functional Assessment Tool Used: Clinical judgement Functional Limitation: Self care Self Care Current Status (I6962(G8987): At least 80 percent but less than 100 percent impaired, limited or restricted Self Care Goal Status (X5284(G8988): At least 40 percent but less than 60 percent impaired, limited or restricted   Marica OtterMaryellen Elmin Wiederholt, OTR/L [phone] 02/21/2017  Conlin Brahm 02/21/2017, 11:34 AM

## 2017-02-21 NOTE — NC FL2 (Signed)
[place] MEDICAID FL2 LEVEL OF CARE SCREENING TOOL     IDENTIFICATION  Patient Name: Sheri Payne Birthdate: 10/17/1925 Sex: female Admission Date (Current Location): 02/20/2017  [facility] [place]   Facility and Address:  [facility],  [address], [place] [id number]      Provider Number: [id number]  Attending Physician Name and Address:  Eddie Northhungel, Nishant, MD  Relative Name and Phone Number:       Current Level of Care: Hospital Recommended Level of Care: Memory Care Prior Approval Number:    Date Approved/Denied:   PASRR Number:    Discharge Plan: Other (Comment) (Memory Care Unit)    Current Diagnoses: Patient Active Problem List   Diagnosis Date Noted  . Closed nondisplaced fracture of right patella, initial encounter 02/20/2017  . Closed nondisplaced fracture of left patella, initial encounter 02/20/2017    Orientation RESPIRATION BLADDER Height & Weight     Self, Situation, Place  Normal Continent Weight: 57.3 kg (126 lb 5.2 oz) Height:  5\' 5"  (165.1 cm)  BEHAVIORAL SYMPTOMS/MOOD NEUROLOGICAL BOWEL NUTRITION STATUS  Other (Comment) (no behaviors)   Continent Diet (Regular)  AMBULATORY STATUS COMMUNICATION OF NEEDS Skin   Extensive Assist Verbally Normal                       Personal Care Assistance Level of Assistance  Bathing, Feeding, Dressing Bathing Assistance: Limited assistance Feeding assistance: Independent Dressing Assistance: Limited assistance     Functional Limitations Info  Sight, Hearing, Speech Sight Info: Adequate Hearing Info: Adequate Speech Info: Adequate    SPECIAL CARE FACTORS FREQUENCY  PT (By licensed PT), OT (By licensed OT)     PT Frequency: 3x wk OT Frequency: 3x wk            Contractures Contractures Info: Not present    Additional Factors Info  Code Status, Allergies Code Status Info: Full Code Allergies Info: Sulfa Antibiotics           Current  Medications (02/21/2017):  This is the current hospital active medication list Current Facility-Administered Medications  Medication Dose Route Frequency Provider Last Rate Last Dose  . acetaminophen (TYLENOL) tablet 650 mg  650 mg Oral Q6H PRN Hillary BowGardner, Jared M, DO   650 mg at 02/21/17 1406   Or  . acetaminophen (TYLENOL) suppository 650 mg  650 mg Rectal Q6H PRN Hillary BowGardner, Jared M, DO      . aspirin EC tablet 81 mg  81 mg Oral Daily Lyda PeroneGardner, Jared M, DO   81 mg at 02/21/17 1029  . brimonidine (ALPHAGAN) 0.2 % ophthalmic solution 1 drop  1 drop Right Eye BID Lyda PeroneGardner, Jared M, DO   1 drop at 02/21/17 1034  . brinzolamide (AZOPT) 1 % ophthalmic suspension 1 drop  1 drop Right Eye BID Lyda PeroneGardner, Jared M, DO   1 drop at 02/21/17 1035  . enoxaparin (LOVENOX) injection 40 mg  40 mg Subcutaneous QHS Lyda PeroneGardner, Jared M, DO   40 mg at 02/21/17 0021  . ketorolac (ACULAR) 0.5 % ophthalmic solution 1 drop  1 drop Right Eye q morning - 10a Hillary BowGardner, Jared M, DO   1 drop at 02/21/17 1034  . latanoprost (XALATAN) 0.005 % ophthalmic solution 1 drop  1 drop Right Eye QHS Lyda PeroneGardner, Jared M, DO   1 drop at 02/21/17 0023  . ondansetron (ZOFRAN) tablet 4 mg  4 mg Oral Q6H PRN Hillary BowGardner, Jared M, DO  Or  . ondansetron (ZOFRAN) injection 4 mg  4 mg Intravenous Q6H PRN Hillary BowGardner, Jared M, DO      . timolol (TIMOPTIC) 0.5 % ophthalmic solution 1 drop  1 drop Right Eye BID Hillary BowGardner, Jared M, DO   1 drop at 02/21/17 1030     Discharge Medications: Please see discharge summary for a list of discharge medications. Medication List     TAKE these medications   aspirin EC 81 MG tablet Take 81 mg by mouth daily.   bimatoprost 0.03 % ophthalmic solution Commonly known as:  LUMIGAN Place 1 drop into the right eye at bedtime.   brimonidine 0.2 % ophthalmic solution Commonly known as:  ALPHAGAN Place 1 drop into the right eye 2 (two) times daily.   brinzolamide 1 % ophthalmic suspension Commonly known as:  AZOPT Place 1  drop into the right eye 2 (two) times daily.   diclofenac 0.1 % ophthalmic solution Commonly known as:  VOLTAREN Place 1 drop into the right eye every morning.   Melatonin 3 MG Caps Take 3 mg by mouth at bedtime as needed (sleep).   timolol 0.5 % ophthalmic solution Commonly known as:  TIMOPTIC Place 1 drop into the right eye 2 (two) times daily.   traMADol 50 MG tablet Commonly known as:  ULTRAM Take 1 tablet (50 mg total) by mouth every 6 (six) hours as needed for moderate pain.        Relevant Imaging Results:  Relevant Lab Results:   Additional Information SS # [id number]. Pt will need hospital bed, w/c, PT, OT at memory care unit.   Bilbo Carcamo, Dickey GaveJamie Lee, LCSW

## 2017-02-21 NOTE — Progress Notes (Signed)
Spoke to son Casimiro NeedleMichael on phone-informed about observation status-voiced understanding-agree to private pay if needed.. PT recc SNF. CSW notified.

## 2017-02-21 NOTE — Progress Notes (Signed)
PROGRESS NOTE                                                                                                                                                                                                             Patient Demographics:    Sheri Payne, is a 83 y.o. female, DOB - [birth year], ZHY:[id number]  Admit date - 02/20/2017   Admitting Physician Hillary Bow, DO  Outpatient Primary MD for the patient is System, Pcp Not In  LOS - 0  Outpatient Specialists: none  Chief Complaint  Patient presents with  . Fall       Brief Narrative   83 year old female with history of glaucoma, mild to moderate dementia, resident of ALF memory care presented to the ED after sustaining a fall at the facility landed on her bilateral knees and bruised her chin. She sustained bilateral nondisplaced patellar fractures. Bilateral knee nebulizers applied and placed on observation. Orthopedics consulted.   Subjective:   Patient denies any pain in her knees at present.   Assessment  & Plan :   Principle Problem Closed nondisplaced fracture of bilateral patella Secondary to mechanical fall. Continue knee immobilizers. Orthopedics consult appreciated. Pain control with when necessary Vicodin. Okay to weight-bear as tolerated but no range of motion of either knee. PT/OT eval (needs either SNF versus PT/OT back at her assisted living if accepted) Follow-up with [facility] orthopedics (Dr. Charlann Boxer) in 2 weeks.  History of glaucoma Continue home eyedrops.   Code Status : Full code  Family Communication  : None at bedside  Disposition Plan  : Return to ALF vs SNF possibly on 7/26  Barriers For Discharge : active symptoms   Consults  :  [facility] orthopedics  Procedures  : none  DVT Prophylaxis  :  Lovenox -   Lab Results  Component Value Date   PLT 208 02/20/2017    Antibiotics  :    Anti-infectives    None        Objective:   Vitals:   02/20/17 1741 02/20/17 2145 02/20/17 2354 02/21/17 0504  BP: (!) 163/83 129/73 (!) 149/72 (!) 123/59  Pulse: 64 78  63  Resp: 18 18 18 18   Temp: 97.7 F (36.5 C)  98.1 F (36.7 C) 98.8 F (37.1 C)  TempSrc: Oral  Oral Oral  SpO2: 96% 99%  96% 95%  Weight:   57.3 kg (126 lb 5.2 oz)   Height:   5\' 5"  (1.651 m)     Wt Readings from Last 3 Encounters:  02/20/17 57.3 kg (126 lb 5.2 oz)     Intake/Output Summary (Last 24 hours) at 02/21/17 1348 Last data filed at 02/21/17 1320  Gross per 24 hour  Intake              420 ml  Output                0 ml  Net              420 ml     Physical Exam  Gen: not in distress HEENT: Bruising of the chin, no pallor, moist mucosa, supple neck Chest: clear b/l, no added sounds CVS: N S1&S2, no murmurs, GI: soft, NT, ND,  Musculoskeletal: warm, b/l knee immobilizer CNS: AAOX2, non focal    Data Review:    CBC  Recent Labs Lab 02/20/17 2050  WBC 12.1*  HGB 13.6  HCT 39.2  PLT 208  MCV 91.6  MCH 31.8  MCHC 34.7  RDW 13.0  LYMPHSABS 1.8  MONOABS 1.1*  EOSABS 0.1  BASOSABS 0.0    Chemistries   Recent Labs Lab 02/20/17 2050  NA 135  K 4.1  CL 105  CO2 22  GLUCOSE 154*  BUN 11  CREATININE 0.78  CALCIUM 8.6*  AST 26  ALT 18  ALKPHOS 60  BILITOT 0.8   ------------------------------------------------------------------------------------------------------------------ No results for input(s): CHOL, HDL, LDLCALC, TRIG, CHOLHDL, LDLDIRECT in the last 72 hours.  No results found for: HGBA1C ------------------------------------------------------------------------------------------------------------------ No results for input(s): TSH, T4TOTAL, T3FREE, THYROIDAB in the last 72 hours.  Invalid input(s): FREET3 ------------------------------------------------------------------------------------------------------------------ No results for input(s): VITAMINB12, FOLATE, FERRITIN, TIBC, IRON,  RETICCTPCT in the last 72 hours.  Coagulation profile No results for input(s): INR, PROTIME in the last 168 hours.  No results for input(s): DDIMER in the last 72 hours.  Cardiac Enzymes No results for input(s): CKMB, TROPONINI, MYOGLOBIN in the last 168 hours.  Invalid input(s): CK ------------------------------------------------------------------------------------------------------------------ No results found for: BNP  Inpatient Medications  Scheduled Meds: . aspirin EC  81 mg Oral Daily  . brimonidine  1 drop Right Eye BID  . brinzolamide  1 drop Right Eye BID  . enoxaparin (LOVENOX) injection  40 mg Subcutaneous QHS  . ketorolac  1 drop Right Eye q morning - 10a  . latanoprost  1 drop Right Eye QHS  . timolol  1 drop Right Eye BID   Continuous Infusions: PRN Meds:.acetaminophen **OR** acetaminophen, ondansetron **OR** ondansetron (ZOFRAN) IV  Micro Results No results found for this or any previous visit (from the past 240 hour(s)).  Radiology Reports Dg Lumbar Spine Complete  Result Date: 02/20/2017 CLINICAL DATA:  Trip and fall with low back pain, initial encounter EXAM: LUMBAR SPINE - COMPLETE 4+ VIEW COMPARISON:  None. FINDINGS: Five lumbar type vertebral bodies are well visualized. Mild osteopenia is noted. Compression deformities are noted at T10, T12 and L1. They have a more chronic appearance although an acute component cannot be excluded on the basis of this exam. Mild degenerative changes in the facet joints are seen. No anterolisthesis is noted. No other focal abnormality is noted. IMPRESSION: T10, T12 and L1 and compression deformities likely of a chronic nature. Correlation to point tenderness is recommended. CT may be helpful for further evaluation. Electronically Signed   By: Eulah Pont.D.  On: 02/20/2017 19:58   Ct Head Wo Contrast  Result Date: 02/20/2017 CLINICAL DATA:  Trip and fall. EXAM: CT HEAD WITHOUT CONTRAST CT CERVICAL SPINE WITHOUT CONTRAST  TECHNIQUE: Multidetector CT imaging of the head and cervical spine was performed following the standard protocol without intravenous contrast. Multiplanar CT image reconstructions of the cervical spine were also generated. COMPARISON:  Head CT in 04/2021 10 FINDINGS: CT HEAD FINDINGS Brain: No acute hemorrhage. No evidence of acute ischemia. Generalized atrophy and chronic small vessel ischemia, with mild progression from prior exam. There is minimal encephalomalacia in the right temporal occipital lobe consistent with remote infarct. No subdural or extra-axial fluid collection. No hydrocephalus, the basilar cisterns are patent. Gray-white differentiation is preserved. Vascular: Atherosclerosis of skullbase vasculature without hyperdense vessel or abnormal calcification. Skull: There is diffuse bony demineralization. No skull fracture. No focal lesion. Sinuses/Orbits: Paranasal sinuses and mastoid air cells are clear. The visualized orbits are unremarkable. Probable bilateral cataract resection. Other: None. CT CERVICAL SPINE FINDINGS Alignment: Normal. Skull base and vertebrae: Mild compression fracture of T1 with approximately 25% loss of height centrally, age indeterminate. No adjacent soft tissue edema to suggest acute fracture. Cervical vertebral body heights are maintained. The dens and skull base are intact. Diffuse bony under mineralization. Soft tissues and spinal canal: No prevertebral fluid or swelling. No visible canal hematoma. Disc levels: Endplate spurring with preservation of disc spaces. Scattered facet arthropathy, normal for age. Upper chest: Mild right apical pleuroparenchymal scarring. No acute abnormality. Other: Mild carotid calcifications. IMPRESSION: 1. No acute intracranial abnormality. Age related atrophy and chronic small vessel ischemia. 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 2. T1 compression fracture, technically age  indeterminate but likely remote. No prior exams for comparison. No fracture or subluxation of the cervical spine. Electronically Signed   By: Rubye OaksMelanie  Ehinger M.D.   On: 02/20/2017 20:19   Ct Cervical Spine Wo Contrast  Result Date: 02/20/2017 CLINICAL DATA:  Trip and fall. EXAM: CT HEAD WITHOUT CONTRAST CT CERVICAL SPINE WITHOUT CONTRAST TECHNIQUE: Multidetector CT imaging of the head and cervical spine was performed following the standard protocol without intravenous contrast. Multiplanar CT image reconstructions of the cervical spine were also generated. COMPARISON:  Head CT in 04/2021 10 FINDINGS: CT HEAD FINDINGS Brain: No acute hemorrhage. No evidence of acute ischemia. Generalized atrophy and chronic small vessel ischemia, with mild progression from prior exam. There is minimal encephalomalacia in the right temporal occipital lobe consistent with remote infarct. No subdural or extra-axial fluid collection. No hydrocephalus, the basilar cisterns are patent. Gray-white differentiation is preserved. Vascular: Atherosclerosis of skullbase vasculature without hyperdense vessel or abnormal calcification. Skull: There is diffuse bony demineralization. No skull fracture. No focal lesion. Sinuses/Orbits: Paranasal sinuses and mastoid air cells are clear. The visualized orbits are unremarkable. Probable bilateral cataract resection. Other: None. CT CERVICAL SPINE FINDINGS Alignment: Normal. Skull base and vertebrae: Mild compression fracture of T1 with approximately 25% loss of height centrally, age indeterminate. No adjacent soft tissue edema to suggest acute fracture. Cervical vertebral body heights are maintained. The dens and skull base are intact. Diffuse bony under mineralization. Soft tissues and spinal canal: No prevertebral fluid or swelling. No visible canal hematoma. Disc levels: Endplate spurring with preservation of disc spaces. Scattered facet arthropathy, normal for age. Upper chest: Mild right  apical pleuroparenchymal scarring. No acute abnormality. Other: Mild carotid calcifications. IMPRESSION: 1. No acute intracranial abnormality. Age related atrophy and chronic small vessel ischemia.  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 2. T1 compression fracture, technically age indeterminate but likely remote. No prior exams for comparison. No fracture or subluxation of the cervical spine. Electronically Signed   By: Rubye OaksMelanie  Ehinger M.D.   On: 02/20/2017 20:19   Dg Knee Complete 4 Views Left  Result Date: 02/20/2017 CLINICAL DATA:  Recent trip and fall with left knee pain, initial encounter EXAM: LEFT KNEE - COMPLETE 4+ VIEW COMPARISON:  None. FINDINGS: There is a transverse fracture through the patella in the midbody without significant distraction of the fracture fragments. Joint effusion is seen. No other fracture is noted. IMPRESSION: Mid patellar fracture without fracture fragment distraction. Joint effusion is noted. Electronically Signed   By: Alcide CleverMark  Lukens M.D.   On: 02/20/2017 19:59   Dg Knee Complete 4 Views Right  Result Date: 02/20/2017 CLINICAL DATA:  Recent trip and fall with right knee pain, initial encounter EXAM: RIGHT KNEE - COMPLETE 4+ VIEW COMPARISON:  None. FINDINGS: Mid patellar fracture is noted without significant fracture distraction. Large joint effusion is seen. No other focal abnormality is noted. IMPRESSION: Mid patellar fracture without significant distraction of the fracture fragments. Joint effusion is noted. Electronically Signed   By: Alcide CleverMark  Lukens M.D.   On: 02/20/2017 20:00    Time Spent in minutes  25   Eddie NorthHUNGEL, Eshika Reckart M.D on 02/21/2017 at 1:48 PM  Between 7am to 7pm - Pager - [phone]  After 7pm go to [email or web address] - password [email or web address]  [facility] -  Office  [phone]

## 2017-02-21 NOTE — Discharge Instructions (Signed)
Maintain bilateral knee immobilizer at all times. Weightbearing as tolerated. NO range of motion of  both knees. Follow-up with orthopedic surgeon Dr. Charlann Boxerlin in 2 weeks.

## 2017-02-21 NOTE — Care Management Obs Status (Signed)
MEDICARE OBSERVATION STATUS NOTIFICATION   Patient Details  Name: Sheri Payne MRN: [id number] Date of Birth: 07/27/1926   Medicare Observation Status Notification Given:  Yes    Lanier ClamMahabir, Tihanna Goodson, RN 02/21/2017, 12:07 PM

## 2017-02-22 DIAGNOSIS — S82036A Nondisplaced transverse fracture of unspecified patella, initial encounter for closed fracture: Secondary | ICD-10-CM

## 2017-02-22 NOTE — Progress Notes (Signed)
PROGRESS NOTE                                                                                                                                                                                                             Patient Demographics:    Sheri Payne, is a 83 y.o. female, DOB - 05/14/1926, RUE:[id number]  Admit date - 02/20/2017   Admitting Physician Hillary Bow, DO  Outpatient Primary MD for the patient is System, Pcp Not In  LOS - 0  Outpatient Specialists: none  Chief Complaint  Patient presents with  . Fall       Brief Narrative   83 year old female with history of glaucoma, mild to moderate dementia, resident of ALF memory care presented to the ED after sustaining a fall at the facility landed on her bilateral knees and bruised her chin. She sustained bilateral nondisplaced patellar fractures. Bilateral knee nebulizers applied and placed on observation. Orthopedics consulted.   Subjective:   No overnight events. Was supposed to be discharged yesterday but hospital bed was not delivered at the assisted living.   Assessment  & Plan :   Principle Problem Closed nondisplaced fracture of bilateral patella Secondary to mechanical fall. Continue knee immobilizers At all times. Weightbearing as tolerated, no movement of the knees permitted. Vicodin as needed.  Patient ready for discharge back to assisted living with physical therapy.  Lab Results  Component Value Date   PLT 208 02/20/2017    Antibiotics  :    Anti-infectives    None        Objective:   Vitals:   02/20/17 2354 02/21/17 0504 02/21/17 1428 02/22/17 0616  BP: (!) 149/72 (!) 123/59 (!) 153/77 139/88  Pulse:  63 66 72  Resp: 18 18 17 18   Temp: 98.1 F (36.7 C) 98.8 F (37.1 C) 98.1 F (36.7 C) 98.5 F (36.9 C)  TempSrc: Oral Oral Oral Oral  SpO2: 96% 95% 98% 97%  Weight: 57.3 kg (126 lb 5.2 oz)     Height: 5\' 5"  (1.651 m)        Wt Readings from Last 3 Encounters:  02/20/17 57.3 kg (126 lb 5.2 oz)     Intake/Output Summary (Last 24 hours) at 02/22/17 1057 Last data filed at 02/22/17 0616  Gross per 24 hour  Intake  480 ml  Output                0 ml  Net              480 ml     Physical Exam  Gen: not in distress HEENT: Bruising of the chin,  Chest: clear b/l,  CVS: N S1&S2, no murmurs, GI: soft,  ND,  Musculoskeletal: warm, b/l knee immobilizer     Data Review:    CBC  Recent Labs Lab 02/20/17 2050  WBC 12.1*  HGB 13.6  HCT 39.2  PLT 208  MCV 91.6  MCH 31.8  MCHC 34.7  RDW 13.0  LYMPHSABS 1.8  MONOABS 1.1*  EOSABS 0.1  BASOSABS 0.0    Chemistries   Recent Labs Lab 02/20/17 2050  NA 135  K 4.1  CL 105  CO2 22  GLUCOSE 154*  BUN 11  CREATININE 0.78  CALCIUM 8.6*  AST 26  ALT 18  ALKPHOS 60  BILITOT 0.8   ------------------------------------------------------------------------------------------------------------------ No results for input(s): CHOL, HDL, LDLCALC, TRIG, CHOLHDL, LDLDIRECT in the last 72 hours.  No results found for: HGBA1C ------------------------------------------------------------------------------------------------------------------ No results for input(s): TSH, T4TOTAL, T3FREE, THYROIDAB in the last 72 hours.  Invalid input(s): FREET3 ------------------------------------------------------------------------------------------------------------------ No results for input(s): VITAMINB12, FOLATE, FERRITIN, TIBC, IRON, RETICCTPCT in the last 72 hours.  Coagulation profile No results for input(s): INR, PROTIME in the last 168 hours.  No results for input(s): DDIMER in the last 72 hours.  Cardiac Enzymes No results for input(s): CKMB, TROPONINI, MYOGLOBIN in the last 168 hours.  Invalid input(s): CK ------------------------------------------------------------------------------------------------------------------ No results found  for: BNP  Inpatient Medications  Scheduled Meds: . aspirin EC  81 mg Oral Daily  . brimonidine  1 drop Right Eye BID  . brinzolamide  1 drop Right Eye BID  . enoxaparin (LOVENOX) injection  40 mg Subcutaneous QHS  . ketorolac  1 drop Right Eye q morning - 10a  . latanoprost  1 drop Right Eye QHS  . timolol  1 drop Right Eye BID   Continuous Infusions: PRN Meds:.acetaminophen **OR** acetaminophen, ondansetron **OR** ondansetron (ZOFRAN) IV  Micro Results No results found for this or any previous visit (from the past 240 hour(s)).  Radiology Reports Dg Lumbar Spine Complete  Result Date: 02/20/2017 CLINICAL DATA:  Trip and fall with low back pain, initial encounter EXAM: LUMBAR SPINE - COMPLETE 4+ VIEW COMPARISON:  None. FINDINGS: Five lumbar type vertebral bodies are well visualized. Mild osteopenia is noted. Compression deformities are noted at T10, T12 and L1. They have a more chronic appearance although an acute component cannot be excluded on the basis of this exam. Mild degenerative changes in the facet joints are seen. No anterolisthesis is noted. No other focal abnormality is noted. IMPRESSION: T10, T12 and L1 and compression deformities likely of a chronic nature. Correlation to point tenderness is recommended. CT may be helpful for further evaluation. Electronically Signed   By: Alcide Clever M.D.   On: 02/20/2017 19:58   Ct Head Wo Contrast  Result Date: 02/20/2017 CLINICAL DATA:  Trip and fall. EXAM: CT HEAD WITHOUT CONTRAST CT CERVICAL SPINE WITHOUT CONTRAST TECHNIQUE: Multidetector CT imaging of the head and cervical spine was performed following the standard protocol without intravenous contrast. Multiplanar CT image reconstructions of the cervical spine were also generated. COMPARISON:  Head CT in 04/2021 10 FINDINGS: CT HEAD FINDINGS Brain: No acute hemorrhage. No evidence of acute ischemia. Generalized atrophy and chronic small vessel ischemia,  with mild progression from  prior exam. There is minimal encephalomalacia in the right temporal occipital lobe consistent with remote infarct. No subdural or extra-axial fluid collection. No hydrocephalus, the basilar cisterns are patent. Gray-white differentiation is preserved. Vascular: Atherosclerosis of skullbase vasculature without hyperdense vessel or abnormal calcification. Skull: There is diffuse bony demineralization. No skull fracture. No focal lesion. Sinuses/Orbits: Paranasal sinuses and mastoid air cells are clear. The visualized orbits are unremarkable. Probable bilateral cataract resection. Other: None. CT CERVICAL SPINE FINDINGS Alignment: Normal. Skull base and vertebrae: Mild compression fracture of T1 with approximately 25% loss of height centrally, age indeterminate. No adjacent soft tissue edema to suggest acute fracture. Cervical vertebral body heights are maintained. The dens and skull base are intact. Diffuse bony under mineralization. Soft tissues and spinal canal: No prevertebral fluid or swelling. No visible canal hematoma. Disc levels: Endplate spurring with preservation of disc spaces. Scattered facet arthropathy, normal for age. Upper chest: Mild right apical pleuroparenchymal scarring. No acute abnormality. Other: Mild carotid calcifications. IMPRESSION: 1. No acute intracranial abnormality. Age related atrophy and chronic small vessel ischemia. 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 2. T1 compression fracture, technically age indeterminate but likely remote. No prior exams for comparison. No fracture or subluxation of the cervical spine. Electronically Signed   By: Rubye OaksMelanie  Ehinger M.D.   On: 02/20/2017 20:19   Ct Cervical Spine Wo Contrast  Result Date: 02/20/2017 CLINICAL DATA:  Trip and fall. EXAM: CT HEAD WITHOUT CONTRAST CT CERVICAL SPINE WITHOUT CONTRAST TECHNIQUE: Multidetector CT imaging of the head and cervical spine was performed  following the standard protocol without intravenous contrast. Multiplanar CT image reconstructions of the cervical spine were also generated. COMPARISON:  Head CT in 04/2021 10 FINDINGS: CT HEAD FINDINGS Brain: No acute hemorrhage. No evidence of acute ischemia. Generalized atrophy and chronic small vessel ischemia, with mild progression from prior exam. There is minimal encephalomalacia in the right temporal occipital lobe consistent with remote infarct. No subdural or extra-axial fluid collection. No hydrocephalus, the basilar cisterns are patent. Gray-white differentiation is preserved. Vascular: Atherosclerosis of skullbase vasculature without hyperdense vessel or abnormal calcification. Skull: There is diffuse bony demineralization. No skull fracture. No focal lesion. Sinuses/Orbits: Paranasal sinuses and mastoid air cells are clear. The visualized orbits are unremarkable. Probable bilateral cataract resection. Other: None. CT CERVICAL SPINE FINDINGS Alignment: Normal. Skull base and vertebrae: Mild compression fracture of T1 with approximately 25% loss of height centrally, age indeterminate. No adjacent soft tissue edema to suggest acute fracture. Cervical vertebral body heights are maintained. The dens and skull base are intact. Diffuse bony under mineralization. Soft tissues and spinal canal: No prevertebral fluid or swelling. No visible canal hematoma. Disc levels: Endplate spurring with preservation of disc spaces. Scattered facet arthropathy, normal for age. Upper chest: Mild right apical pleuroparenchymal scarring. No acute abnormality. Other: Mild carotid calcifications. IMPRESSION: 1. No acute intracranial abnormality. Age related atrophy and chronic small vessel ischemia. 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 2. T1 compression fracture, technically age indeterminate but likely remote. No prior exams for comparison. No fracture or  subluxation of the cervical spine. Electronically Signed   By: Rubye OaksMelanie  Ehinger M.D.   On: 02/20/2017 20:19   Dg Knee Complete 4 Views Left  Result Date: 02/20/2017 CLINICAL DATA:  Recent trip and fall with left knee pain, initial encounter EXAM: LEFT KNEE - COMPLETE 4+ VIEW COMPARISON:  None. FINDINGS: There is a transverse fracture through the patella in the midbody without significant distraction of the fracture fragments. Joint effusion is seen. No other fracture is noted. IMPRESSION: Mid patellar fracture without fracture fragment distraction. Joint effusion is noted. Electronically Signed   By: Alcide CleverMark  Lukens M.D.   On: 02/20/2017 19:59   Dg Knee Complete 4 Views Right  Result Date: 02/20/2017 CLINICAL DATA:  Recent trip and fall with right knee pain, initial encounter EXAM: RIGHT KNEE - COMPLETE 4+ VIEW COMPARISON:  None. FINDINGS: Mid patellar fracture is noted without significant fracture distraction. Large joint effusion is seen. No other focal abnormality is noted. IMPRESSION: Mid patellar fracture without significant distraction of the fracture fragments. Joint effusion is noted. Electronically Signed   By: Alcide CleverMark  Lukens M.D.   On: 02/20/2017 20:00    Time Spent in minutes  15   Eddie NorthHUNGEL, Charie Pinkus M.D on 02/22/2017 at 10:57 AM  Between 7am to 7pm - Pager - [phone]  After 7pm go to [email or web address] - password [email or web address]  [facility] -  Office  [phone]

## 2017-02-22 NOTE — Progress Notes (Signed)
Called the [facility] around 20:30 to see if pts bed had arrived. Staff reported that the bed had not been delivered yet and that they would call us and let us know when it had been delivered. [facility] that [facility].

## 2017-02-22 NOTE — Progress Notes (Signed)
     Subjective: Bilateral non-displaced patella fractures  Patient reports pain as mild, no real pain when not bending the knees.  Comfortably laying in bed with bilateral knee immobilizers.    Objective:   VITALS:   Vitals:   02/21/17 1428 02/22/17 0616  BP: (!) 153/77 139/88  Pulse: 66 72  Resp: 17 18  Temp: 98.1 F (36.7 C) 98.5 F (36.9 C)   KI in proper placement bilaterally  Dorsiflexion/Plantar flexion intact No cellulitis present Compartment soft  LABS  Recent Labs  02/20/17 2050  HGB 13.6  HCT 39.2  WBC 12.1*  PLT 208     Recent Labs  02/20/17 2050  NA 135  K 4.1  BUN 11  CREATININE 0.78  GLUCOSE 154*     Assessment/Plan: Bilateral non-displaced patella fractures  Up with therapy Discharge when medically ready [facility]. Follow up with OLIN,Alvah Lagrow D in 2 weeks.  Contact information:  [facility] [address], [address] [place] [id number] [phone]        Anastasio AuerbachMatthew S. Lee-Ann Gal   PAC  02/22/2017, 8:53 AM

## 2017-02-22 NOTE — Progress Notes (Signed)
Noted patient did not d/c d/t [facility]-informed him of  Bed not delivered-c#[phone]-he will f/u on the delivery. MD/CSW aware.

## 2017-02-22 NOTE — Progress Notes (Signed)
CSW contacted by [facility] to report pt's hospital bed has been delivered to facility. [facility] transportation has been contacted. # for report provided to nsg. CSW will contact pt's son, Casimiro NeedleMichael, once pt has been picked up by [facility] , as requested by son.  Cori RazorJamie Donnovan Stamour LCSW [phone]

## 2017-02-22 NOTE — Progress Notes (Signed)
CSW contacted [facility] this am and was told pt's hospital bed has still not been delivered to facility. CSW will be contacted once hospital bed has arrived at facility.  Cori RazorJamie Delmy Holdren LCSW [phone]

## 2017-02-22 NOTE — Progress Notes (Signed)
[facility], and reported to SaylorvilleLola, Charity fundraiserN.

## 2019-06-11 ENCOUNTER — Emergency Department (HOSPITAL_COMMUNITY): Payer: Medicare Other

## 2019-06-11 ENCOUNTER — Encounter (HOSPITAL_COMMUNITY): Payer: Self-pay

## 2019-06-11 ENCOUNTER — Other Ambulatory Visit: Payer: Self-pay

## 2019-06-11 ENCOUNTER — Inpatient Hospital Stay (HOSPITAL_COMMUNITY)
Admission: EM | Admit: 2019-06-11 | Discharge: 2019-06-17 | DRG: 481 | Disposition: A | Discharge status: Home / Self Care | Payer: Medicare Other | Source: Skilled Nursing Facility | Attending: Internal Medicine | Admitting: Internal Medicine

## 2019-06-11 DIAGNOSIS — Z79899 Other long term (current) drug therapy: Secondary | ICD-10-CM | POA: Diagnosis not present

## 2019-06-11 DIAGNOSIS — Z20828 Contact with and (suspected) exposure to other viral communicable diseases: Secondary | ICD-10-CM | POA: Diagnosis present

## 2019-06-11 DIAGNOSIS — Z791 Long term (current) use of non-steroidal anti-inflammatories (NSAID): Secondary | ICD-10-CM

## 2019-06-11 DIAGNOSIS — Z9049 Acquired absence of other specified parts of digestive tract: Secondary | ICD-10-CM | POA: Diagnosis not present

## 2019-06-11 DIAGNOSIS — S72009A Fracture of unspecified part of neck of unspecified femur, initial encounter for closed fracture: Secondary | ICD-10-CM | POA: Diagnosis present

## 2019-06-11 DIAGNOSIS — F0390 Unspecified dementia without behavioral disturbance: Secondary | ICD-10-CM | POA: Diagnosis present

## 2019-06-11 DIAGNOSIS — S72142A Displaced intertrochanteric fracture of left femur, initial encounter for closed fracture: Principal | ICD-10-CM | POA: Diagnosis present

## 2019-06-11 DIAGNOSIS — H409 Unspecified glaucoma: Secondary | ICD-10-CM | POA: Diagnosis present

## 2019-06-11 DIAGNOSIS — Y92099 Unspecified place in other non-institutional residence as the place of occurrence of the external cause: Secondary | ICD-10-CM | POA: Diagnosis not present

## 2019-06-11 DIAGNOSIS — Z87891 Personal history of nicotine dependence: Secondary | ICD-10-CM

## 2019-06-11 DIAGNOSIS — D62 Acute posthemorrhagic anemia: Secondary | ICD-10-CM | POA: Diagnosis not present

## 2019-06-11 DIAGNOSIS — Z79890 Hormone replacement therapy (postmenopausal): Secondary | ICD-10-CM | POA: Diagnosis not present

## 2019-06-11 DIAGNOSIS — M25552 Pain in left hip: Secondary | ICD-10-CM | POA: Diagnosis present

## 2019-06-11 DIAGNOSIS — T148XXA Other injury of unspecified body region, initial encounter: Secondary | ICD-10-CM

## 2019-06-11 DIAGNOSIS — E876 Hypokalemia: Secondary | ICD-10-CM | POA: Diagnosis not present

## 2019-06-11 DIAGNOSIS — Z882 Allergy status to sulfonamides status: Secondary | ICD-10-CM | POA: Diagnosis not present

## 2019-06-11 DIAGNOSIS — Z7982 Long term (current) use of aspirin: Secondary | ICD-10-CM

## 2019-06-11 DIAGNOSIS — W010XXA Fall on same level from slipping, tripping and stumbling without subsequent striking against object, initial encounter: Secondary | ICD-10-CM | POA: Diagnosis present

## 2019-06-11 LAB — CBC WITH DIFFERENTIAL/PLATELET
Abs Immature Granulocytes: 0.17 10*3/uL — ABNORMAL HIGH (ref 0.00–0.07)
Basophils Absolute: 0.1 10*3/uL (ref 0.0–0.1)
Basophils Relative: 1 %
Eosinophils Absolute: 0.1 10*3/uL (ref 0.0–0.5)
Eosinophils Relative: 1 %
HCT: 39.5 % (ref 36.0–46.0)
Hemoglobin: 13 g/dL (ref 12.0–15.0)
Immature Granulocytes: 1 %
Lymphocytes Relative: 25 %
Lymphs Abs: 3.1 10*3/uL (ref 0.7–4.0)
MCH: 31.3 pg (ref 26.0–34.0)
MCHC: 32.9 g/dL (ref 30.0–36.0)
MCV: 95.2 fL (ref 80.0–100.0)
Monocytes Absolute: 0.9 10*3/uL (ref 0.1–1.0)
Monocytes Relative: 7 %
Neutro Abs: 8.1 10*3/uL — ABNORMAL HIGH (ref 1.7–7.7)
Neutrophils Relative %: 65 %
Platelets: 242 10*3/uL (ref 150–400)
RBC: 4.15 MIL/uL (ref 3.87–5.11)
RDW: 13.3 % (ref 11.5–15.5)
WBC: 12.4 10*3/uL — ABNORMAL HIGH (ref 4.0–10.5)
nRBC: 0 % (ref 0.0–0.2)

## 2019-06-11 LAB — BASIC METABOLIC PANEL
Anion gap: 9 (ref 5–15)
BUN: 10 mg/dL (ref 8–23)
CO2: 22 mmol/L (ref 22–32)
Calcium: 8.7 mg/dL — ABNORMAL LOW (ref 8.9–10.3)
Chloride: 107 mmol/L (ref 98–111)
Creatinine, Ser: 0.84 mg/dL (ref 0.44–1.00)
GFR calc Af Amer: >60 mL/min (ref >60)
GFR calc non Af Amer: >60 mL/min (ref >60)
Glucose, Bld: 178 mg/dL — ABNORMAL HIGH (ref 70–99)
Potassium: 3.1 mmol/L — ABNORMAL LOW (ref 3.5–5.1)
Sodium: 138 mmol/L (ref 135–145)

## 2019-06-11 LAB — PROTIME-INR
INR: 1.1 (ref 0.8–1.2)
Prothrombin Time: 14.2 seconds (ref 11.4–15.2)

## 2019-06-11 LAB — TYPE AND SCREEN
ABO/RH(D): A POS
Antibody Screen: NEGATIVE

## 2019-06-11 LAB — SARS CORONAVIRUS 2 (TAT 6-24 HRS): SARS Coronavirus 2: NEGATIVE

## 2019-06-11 LAB — ABO/RH: ABO/RH(D): A POS

## 2019-06-11 MED ORDER — CEFAZOLIN SODIUM-DEXTROSE 2-4 GM/100ML-% IV SOLN
2.0000 g | INTRAVENOUS | Status: AC
  Administered 2019-06-12: 2 g via INTRAVENOUS
  Filled 2019-06-11: qty 100

## 2019-06-11 MED ORDER — ALBUTEROL SULFATE HFA 108 (90 BASE) MCG/ACT IN AERS
2.0000 | INHALATION_SPRAY | Freq: Three times a day (TID) | RESPIRATORY_TRACT | Status: DC | PRN
  Filled 2019-06-11: qty 6.7

## 2019-06-11 MED ORDER — TRANEXAMIC ACID-NACL 1000-0.7 MG/100ML-% IV SOLN
1000.0000 mg | INTRAVENOUS | Status: AC
  Administered 2019-06-12: 1000 mg via INTRAVENOUS
  Filled 2019-06-11: qty 100

## 2019-06-11 MED ORDER — LATANOPROST 0.005 % OP SOLN
1.0000 [drp] | Freq: Every day | OPHTHALMIC | Status: DC
  Administered 2019-06-16: 1 [drp] via OPHTHALMIC
  Filled 2019-06-11 (×2): qty 2.5

## 2019-06-11 MED ORDER — ASPIRIN EC 81 MG PO TBEC
81.0000 mg | DELAYED_RELEASE_TABLET | Freq: Every day | ORAL | Status: DC
  Administered 2019-06-13 – 2019-06-17 (×5): 81 mg via ORAL
  Filled 2019-06-11 (×6): qty 1

## 2019-06-11 MED ORDER — TRANEXAMIC ACID 1000 MG/10ML IV SOLN
2000.0000 mg | INTRAVENOUS | Status: DC
  Filled 2019-06-11: qty 20

## 2019-06-11 MED ORDER — SODIUM CHLORIDE 0.9 % IV SOLN
INTRAVENOUS | Status: DC | PRN
  Administered 2019-06-11: 1000 mL via INTRAVENOUS

## 2019-06-11 MED ORDER — NON FORMULARY: 1.0000 [drp] | Freq: Two times a day (BID) | Status: DC

## 2019-06-11 MED ORDER — ENOXAPARIN SODIUM 40 MG/0.4ML ~~LOC~~ SOLN: 40.0000 mg | SUBCUTANEOUS | Status: DC

## 2019-06-11 MED ORDER — POTASSIUM CHLORIDE 10 MEQ/100ML IV SOLN
10.0000 meq | INTRAVENOUS | Status: AC
  Administered 2019-06-11 (×5): 10 meq via INTRAVENOUS
  Filled 2019-06-11 (×6): qty 100

## 2019-06-11 MED ORDER — POLYETHYLENE GLYCOL 3350 17 G PO PACK
17.0000 g | PACK | Freq: Every day | ORAL | Status: DC | PRN
  Administered 2019-06-16 – 2019-06-17 (×2): 17 g via ORAL
  Filled 2019-06-11 (×2): qty 1

## 2019-06-11 MED ORDER — MONTELUKAST SODIUM 10 MG PO TABS
10.0000 mg | ORAL_TABLET | Freq: Every day | ORAL | Status: DC
  Administered 2019-06-12 – 2019-06-16 (×6): 10 mg via ORAL
  Filled 2019-06-11 (×6): qty 1

## 2019-06-11 MED ORDER — SENNOSIDES-DOCUSATE SODIUM 8.6-50 MG PO TABS: 1.0000 | ORAL_TABLET | Freq: Every evening | ORAL | Status: DC | PRN

## 2019-06-11 MED ORDER — ACETAMINOPHEN 500 MG PO TABS
500.0000 mg | ORAL_TABLET | Freq: Three times a day (TID) | ORAL | Status: DC | PRN
  Administered 2019-06-13: 500 mg via ORAL

## 2019-06-11 MED ORDER — ADULT MULTIVITAMIN W/MINERALS CH
ORAL_TABLET | Freq: Every day | ORAL | Status: DC
  Administered 2019-06-14 – 2019-06-17 (×4): 1 via ORAL
  Filled 2019-06-11 (×5): qty 1

## 2019-06-11 MED ORDER — SODIUM CHLORIDE 0.9 % IV SOLN
INTRAVENOUS | Status: AC
  Administered 2019-06-12: 03:00:00 via INTRAVENOUS

## 2019-06-11 MED ORDER — POVIDONE-IODINE 10 % EX SWAB: 2.0000 "application " | Freq: Once | CUTANEOUS | Status: DC

## 2019-06-11 MED ORDER — GUAIFENESIN 100 MG/5ML PO SOLN: 200.0000 mg | Freq: Three times a day (TID) | ORAL | Status: DC | PRN

## 2019-06-11 MED ORDER — ENSURE PRE-SURGERY PO LIQD
296.0000 mL | Freq: Once | ORAL | Status: AC
  Administered 2019-06-12: 296 mL via ORAL
  Filled 2019-06-11 (×2): qty 296

## 2019-06-11 MED ORDER — HYDROCODONE-ACETAMINOPHEN 5-325 MG PO TABS
1.0000 | ORAL_TABLET | ORAL | Status: DC | PRN
  Administered 2019-06-12: 2 via ORAL
  Filled 2019-06-11 (×3): qty 1
  Filled 2019-06-11: qty 2
  Filled 2019-06-11 (×2): qty 1

## 2019-06-11 NOTE — ED Notes (Signed)
Called for pt transport with [facility]. Was advised pt is low priority on list so transport may take a while. Will try to call [facility] as pt does not need transport with cardiac monitoring

## 2019-06-11 NOTE — ED Provider Notes (Signed)
[place] [facility] DEPT Provider Note   CSN: [id number] Arrival date & time: 06/11/19  0935     History   Chief Complaint Chief Complaint  Patient presents with   Fall   Hip Pain    HPI Sheri Payne is a 85 y.o. female.     Patient is a 85 year old female who [facility] presenting today after a fall.  Patient states she was transferring from the bed to go to the bathroom this morning and fell onto the floor.  Facility reported an unwitnessed fall from standing and complaint of left hip pain.  Patient denied hitting her head, loss of consciousness.  She does complain of left hip pain but no shortness of breath, chest pain, arm pain.  Patient does not take anticoagulation.  Currently she states her pain is not that bad but when she tries to move her leg it is 10 out of 10.  The history is provided by the patient and the EMS personnel.  Fall  Hip Pain    Past Medical History:  Diagnosis Date   Glaucoma     Patient Active Problem List   Diagnosis Date Noted   Closed nondisplaced fracture of right patella, initial encounter 02/20/2017   Closed nondisplaced fracture of left patella, initial encounter 02/20/2017    Past Surgical History:  Procedure Laterality Date   APPENDECTOMY     EYE SURGERY Right    pt unsure what type of eye surgery but states in right eye     OB History   No obstetric history on file.      Home Medications    Prior to Admission medications   Medication Sig Start Date End Date Taking? Authorizing Provider  aspirin EC 81 MG tablet Take 81 mg by mouth daily.    [provider]  bimatoprost (LUMIGAN) 0.03 % ophthalmic solution Place 1 drop into the right eye at bedtime. 02/18/17   [provider]  brimonidine (ALPHAGAN) 0.2 % ophthalmic solution Place 1 drop into the right eye 2 (two) times daily.    [provider]  brinzolamide (AZOPT) 1 % ophthalmic suspension Place 1  drop into the right eye 2 (two) times daily.    [provider]  diclofenac (VOLTAREN) 0.1 % ophthalmic solution Place 1 drop into the right eye every morning.    [provider]  Melatonin 3 MG CAPS Take 3 mg by mouth at bedtime as needed (sleep).    [provider]  timolol (TIMOPTIC) 0.5 % ophthalmic solution Place 1 drop into the right eye 2 (two) times daily. 09/14/16   [provider]    Family History History reviewed. No pertinent family history.  Social History Social History   Tobacco Use   Smoking status: Former Smoker    Types: Cigarettes    Quit date: 02/20/1945    Years since quitting: 74.3   Smokeless tobacco: Never Used  Substance Use Topics   Alcohol use: Yes    Comment: 1 martini a day most days   Drug use: No     Allergies   Sulfa antibiotics   Review of Systems Review of Systems  All other systems reviewed and are negative.    Physical Exam Updated Vital Signs BP (!) 118/38    Pulse 68    Temp (!) 97.5 F (36.4 C) (Oral)    Resp 15    SpO2 90%   Physical Exam Vitals signs and nursing note reviewed.  Constitutional:      General: She is not in acute distress.    Appearance: She is well-developed and normal weight.  HENT:     Head: Normocephalic and atraumatic.     Mouth/Throat:     Mouth: Mucous membranes are moist.  Eyes:     Pupils: Pupils are equal, round, and reactive to light.  Neck:     Musculoskeletal: Normal range of motion and neck supple. No muscular tenderness.  Cardiovascular:     Rate and Rhythm: Normal rate and regular rhythm.     Heart sounds: Normal heart sounds. No murmur. No friction rub.  Pulmonary:     Effort: Pulmonary effort is normal.     Breath sounds: Normal breath sounds. No wheezing or rales.  Abdominal:     General: Bowel sounds are normal. There is no distension.     Palpations: Abdomen is soft.     Tenderness: There is no abdominal tenderness. There is no guarding or  rebound.  Musculoskeletal:        General: Tenderness and deformity present.     Right hip: Normal.     Left hip: She exhibits decreased range of motion, bony tenderness and deformity.     Right knee: Normal.     Left knee: Normal.     Comments: No edema.  DP pulses present bilaterally and normal sensation and movement of the feet bilaterally  Skin:    General: Skin is warm and dry.     Capillary Refill: Capillary refill takes 2 to 3 seconds.     Findings: No rash.  Neurological:     General: No focal deficit present.     Mental Status: She is alert.     Cranial Nerves: No cranial nerve deficit.     Sensory: No sensory deficit.     Motor: No weakness.     Comments: Oriented to person and place  Psychiatric:        Mood and Affect: Mood normal.        Behavior: Behavior normal.        Thought Content: Thought content normal.      ED Treatments / Results  Labs (all labs ordered are listed, but only abnormal results are displayed) Labs Reviewed  BASIC METABOLIC PANEL - Abnormal; Notable for the following components:      Result Value   Potassium 3.1 (*)    Glucose, Bld 178 (*)    Calcium 8.7 (*)    All other components within normal limits  CBC WITH DIFFERENTIAL/PLATELET - Abnormal; Notable for the following components:   WBC 12.4 (*)    Neutro Abs 8.1 (*)    Abs Immature Granulocytes 0.17 (*)    All other components within normal limits  SARS CORONAVIRUS 2 (TAT 6-24 HRS)  PROTIME-INR  TYPE AND SCREEN    EKG EKG Interpretation  Date/Time:  Wednesday June 11 2019 09:50:05 EST Ventricular Rate:  62 PR Interval:    QRS Duration: 98 QT Interval:  510 QTC Calculation: 518 R Axis:   -51 Text Interpretation: Sinus rhythm Ventricular premature complex Left axis deviation Probable anterior infarct, age indeterminate Prolonged QT interval No previous tracing Confirmed by Gwyneth SproutPlunkett, Johnaton Sonneborn ([id number]) on 06/11/2019 10:07:07 AM   Radiology Dg Hip Unilat With Pelvis 2-3  Views Left  Result Date: 06/11/2019 CLINICAL DATA:  Hip deformity.  Fall. EXAM: DG HIP (WITH OR WITHOUT PELVIS) 2-3V LEFT COMPARISON:  None. FINDINGS: The patient is status post fracture of the  proximal left femur extending from the proximal diaphysis into the intertrochanteric region. No dislocation identified. IMPRESSION: Left hip fracture as above. Electronically Signed   By: Gerome Sam III M.D   On: 06/11/2019 10:17    Procedures Procedures (including critical care time)  Medications Ordered in ED Medications - No data to display   Initial Impression / Assessment and Plan / ED Course  I have reviewed the triage vital signs and the nursing notes.  Pertinent labs & imaging results that were available during my care of the patient were reviewed by me and considered in my medical decision making (see chart for details).        Elderly female with a fall at her home with pain in the left hip.  Patient denied any presyncopal symptoms of dizziness, lightheadedness, chest pain or shortness of breath prior to the fall.  She had no head injury or loss of consciousness after the fall.  She takes no anticoagulation.  Patient does have deformity and pain of the left hip.  Hip fracture protocol initiated.  11:04 AM CBC and BMP without acute findings, EKG with prolonged QT but no old to compare.  X-ray today showing a left intertrochanteric femur fracture.  Discussed this with the patient and with her son who is the power of attorney and he said she would want the surgery done.  Patient does not take anticoagulation.  Spoke with Dr. Roda Shutters.  Who would like the patient transferred to [facility] for surgery tomorrow.  Final Clinical Impressions(s) / ED Diagnoses   Final diagnoses:  Displaced intertrochanteric fracture of left femur, initial encounter for closed fracture [facility])    ED Discharge Orders    None       Gwyneth Sprout, MD 06/11/19 1147

## 2019-06-11 NOTE — Progress Notes (Signed)
I am aware of the patient and have discussed case with Dr. Maryan Rued and have requested [facility] tomorrow afternoon.  I have also discussed plan for surgery and reviewed r/b/a with patient's son Legrand Como) and daughter Defibaugh) and they are in agreement to proceed with surgical repair tomorrow.  NPO after midnight. Please hold lovenox for impending surgery.

## 2019-06-11 NOTE — ED Notes (Signed)
Called report to 5N nurse Lauren.

## 2019-06-11 NOTE — Consult Note (Signed)
ORTHOPAEDIC CONSULTATION  REQUESTING PHYSICIAN: Rodena Goldmann, DO  Chief Complaint: Left intertroch fracture  HPI: Sheri Payne is a 85 y.o. female who presents with left hip fracture s/p unwitnessed fall at assisted living facility.  Has mild to moderate dementia.  The patient endorses severe pain in the left hip, that does not radiate, grinding in quality, worse with any movement, better with immobilization.  Denies LOC/fever/chills/nausea/vomiting.  She is normally ambulatory without using any devices.  Denies LOC, neck pain, abd pain.  Past Medical History:  Diagnosis Date  . Glaucoma    Past Surgical History:  Procedure Laterality Date  . APPENDECTOMY    . EYE SURGERY Right    pt unsure what type of eye surgery but states in right eye   Social History   Socioeconomic History  . Marital status: Widowed    Spouse name: Not on file  . Number of children: Not on file  . Years of education: Not on file  . Highest education level: Not on file  Occupational History  . Not on file  Social Needs  . Financial resource strain: Not on file  . Food insecurity    Worry: Not on file    Inability: Not on file  . Transportation needs    Medical: Not on file    Non-medical: Not on file  Tobacco Use  . Smoking status: Former Smoker    Types: Cigarettes    Quit date: 02/20/1945    Years since quitting: 74.3  . Smokeless tobacco: Never Used  Substance and Sexual Activity  . Alcohol use: Yes    Comment: 1 martini a day most days  . Drug use: No  . Sexual activity: Not on file  Lifestyle  . Physical activity    Days per week: Not on file    Minutes per session: Not on file  . Stress: Not on file  Relationships  . Social Herbalist on phone: Not on file    Gets together: Not on file    Attends religious service: Not on file    Active member of club or organization: Not on file    Attends meetings of clubs or organizations: Not on file    Relationship status:  Not on file  Other Topics Concern  . Not on file  Social History Narrative  . Not on file   History reviewed. No pertinent family history. Allergies  Allergen Reactions  . Sulfa Antibiotics Rash    Other reaction(s): Intolerance   Prior to Admission medications   Medication Sig Start Date End Date Taking? Authorizing Provider  acetaminophen (TYLENOL) 500 MG tablet Take 500 mg by mouth 3 (three) times daily as needed for mild pain, fever or headache.   Yes [provider]  albuterol (VENTOLIN HFA) 108 (90 Base) MCG/ACT inhaler Inhale 2 puffs into the lungs 3 (three) times daily as needed for wheezing.   Yes [provider]  aspirin EC 81 MG tablet Take 81 mg by mouth daily.   Yes [provider]  Brinzolamide-Brimonidine (SIMBRINZA) 1-0.2 % SUSP Place 1 drop into the right eye 2 (two) times daily.   Yes [provider]  guaifenesin (ROBITUSSIN) 100 MG/5ML syrup Take 200 mg by mouth 3 (three) times daily as needed for cough.   Yes [provider]  latanoprost (XALATAN) 0.005 % ophthalmic solution Place 1 drop into both eyes at bedtime.   Yes [provider]  montelukast (SINGULAIR) 10 MG  tablet Take 10 mg by mouth at bedtime.   Yes [provider]  Multiple Vitamins-Minerals (CENTRUM SILVER 50+WOMEN PO) Take 1 tablet by mouth daily.   Yes [provider]  polyethylene glycol (MIRALAX / GLYCOLAX) 17 g packet Take 17 g by mouth daily as needed for mild constipation.   Yes [provider]   Dg Chest 1 View  Result Date: 06/11/2019 CLINICAL DATA:  Left hip fracture.  Preoperative exam. EXAM: CHEST  1 VIEW COMPARISON:  None. FINDINGS: Lungs are clear. Heart size is upper normal. No pneumothorax or pleural effusion. Calcified mediastinal lymph nodes noted. No acute or focal bony abnormality. IMPRESSION: No acute disease. Electronically Signed   By: Drusilla Kanner M.D.   On: 06/11/2019 11:06   Dg Hip Unilat With  Pelvis 2-3 Views Left  Result Date: 06/11/2019 CLINICAL DATA:  Hip deformity.  Fall. EXAM: DG HIP (WITH OR WITHOUT PELVIS) 2-3V LEFT COMPARISON:  None. FINDINGS: The patient is status post fracture of the proximal left femur extending from the proximal diaphysis into the intertrochanteric region. No dislocation identified. IMPRESSION: Left hip fracture as above. Electronically Signed   By: Gerome Sam III M.D   On: 06/11/2019 10:17    All pertinent xrays, MRI, CT independently reviewed and interpreted  Positive ROS: All other systems have been reviewed and were otherwise negative with the exception of those mentioned in the HPI and as above.  Physical Exam: General: Alert, no acute distress Cardiovascular: No pedal edema Respiratory: No cyanosis, no use of accessory musculature GI: No organomegaly, abdomen is soft and non-tender Skin: No lesions in the area of chief complaint Neurologic: Sensation intact distally Psychiatric: Patient is competent for consent with normal mood and affect Lymphatic: No axillary or cervical lymphadenopathy  MUSCULOSKELETAL:  - severe pain with movement of the hip and extremity - skin intact - NVI distally - compartments soft  Assessment: Left intertroch fracture  Plan: - surgical repair is recommended, patient and family are aware of r/b/a and wish to proceed, informed consent obtained - medical optimization per primary team - surgery is planned for Thursday afternoon - appreciate transfer to [facility]  - hold lovenox for upcoming surgery - continue NPO  Thank you for the consult and the opportunity to see [name]  N. Glee Arvin, MD [facility] 7:47 AM

## 2019-06-11 NOTE — H&P (Signed)
History and Physical    Sheri Payne WHQ:[id number] DOB: Dec 11, 1925 DOA: 06/11/2019  PCP: System, Pcp Not In Patient coming from: [facility]  I have personally briefly reviewed patient's old medical records in [facility]  Chief Complaint: Fall  HPI: Sheri Payne is a 85 y.o. female with medical history significant of glaucoma, mild to moderate dementia who presents after an unwitnessed fall at facility today.  Patient reports she had an unwitnessed fall this morning, while trying to get to her restroom.  She states she was by herself.  She denies any preceding symptoms, no chest pain, no palpitations, no loss of consciousness and states she recalls the event though is unaware if she had eaten this morning.  She denies any numbness or sensory deficits.  She believes she just tripped and fell.  She denies any other symptoms.  She states she has not had any fevers, chills, nausea/vomiting, states she is eating well and having normal bowel movements.  She presently reports her L hip pain is well controlled.  She concurs she would like to have surgery if it would be beneficial.  No tobacco use, alcohol use or drug use.  Her son Casimiro Needle is aware and would also like to move forward with surgery.    Review of Systems: As per HPI otherwise 10 point review of systems negative.    Past Medical History:  Diagnosis Date  . Glaucoma     Past Surgical History:  Procedure Laterality Date  . APPENDECTOMY    . EYE SURGERY Right    pt unsure what type of eye surgery but states in right eye     reports that she quit smoking about 74 years ago. Her smoking use included cigarettes. She has never used smokeless tobacco. She reports current alcohol use. She reports that she does not use drugs.  Allergies  Allergen Reactions  . Sulfa Antibiotics Rash    Other reaction(s): Intolerance    History reviewed. No pertinent family history.   Prior to Admission medications    Medication Sig Start Date End Date Taking? Authorizing Provider  acetaminophen (TYLENOL) 500 MG tablet Take 500 mg by mouth 3 (three) times daily as needed for mild pain, fever or headache.   Yes [provider]  albuterol (VENTOLIN HFA) 108 (90 Base) MCG/ACT inhaler Inhale 2 puffs into the lungs 3 (three) times daily as needed for wheezing.   Yes [provider]  aspirin EC 81 MG tablet Take 81 mg by mouth daily.   Yes [provider]  Brinzolamide-Brimonidine (SIMBRINZA) 1-0.2 % SUSP Place 1 drop into the right eye 2 (two) times daily.   Yes [provider]  guaifenesin (ROBITUSSIN) 100 MG/5ML syrup Take 200 mg by mouth 3 (three) times daily as needed for cough.   Yes [provider]  latanoprost (XALATAN) 0.005 % ophthalmic solution Place 1 drop into both eyes at bedtime.   Yes [provider]  montelukast (SINGULAIR) 10 MG tablet Take 10 mg by mouth at bedtime.   Yes [provider]  Multiple Vitamins-Minerals (CENTRUM SILVER 50+WOMEN PO) Take 1 tablet by mouth daily.   Yes [provider]  polyethylene glycol (MIRALAX / GLYCOLAX) 17 g packet Take 17 g by mouth daily as needed for mild constipation.   Yes [provider]    Physical Exam: Vitals:   06/11/19 1200 06/11/19 1300 06/11/19 1315 06/11/19 1330  BP: (!) 96/48 (!) 107/49  (!) 101/55  Pulse: 66   66  Resp: (!) 24 18 (!) 22 (!) 23  Temp:      TempSrc:      SpO2: 94%   94%    Constitutional: NAD, calm, comfortable Vitals:   06/11/19 1200 06/11/19 1300 06/11/19 1315 06/11/19 1330  BP: (!) 96/48 (!) 107/49  (!) 101/55  Pulse: 66   66  Resp: (!) 24 18 (!) 22 (!) 23  Temp:      TempSrc:      SpO2: 94%   94%   General: appears tired, NAD H/Eyes: EOMI, anicteric sclera; NCAT ENMT: Mucous membranes are moist. Posterior pharynx clear of any exudate or lesions. Normal dentition.  Neck: normal, supple, no masses, no thyromegaly Respiratory: clear to  auscultation bilaterally, no wheezing, no crackles. Normal respiratory effort. No accessory muscle use.  Cardiovascular: Regular rate and rhythm, no murmurs / rubs / gallops. No extremity edema. 2+ pedal pulses. No carotid bruits.  Abdomen: no tenderness, no masses palpated. No hepatosplenomegaly. Bowel sounds positive.  Musculoskeletal: no clubbing / cyanosis. No joint deformity upper and lower extremities. Good ROM except for LLE Skin: no rashes, lesions, ulcers. No induration Neurologic: CN 2-12 grossly intact. Sensation intact. Psychiatric: Normal judgment and insight. Alert and oriented x 3. Normal mood.    Labs on Admission: I have personally reviewed following labs and imaging studies  CBC: Recent Labs  Lab 06/11/19 1014  WBC 12.4*  NEUTROABS 8.1*  HGB 13.0  HCT 39.5  MCV 95.2  PLT 242   Basic Metabolic Panel: Recent Labs  Lab 06/11/19 1014  NA 138  K 3.1*  CL 107  CO2 22  GLUCOSE 178*  BUN 10  CREATININE 0.84  CALCIUM 8.7*   GFR: CrCl cannot be calculated (Unknown ideal weight.). Liver Function Tests: No results for input(s): AST, ALT, ALKPHOS, BILITOT, PROT, ALBUMIN in the last 168 hours. No results for input(s): LIPASE, AMYLASE in the last 168 hours. No results for input(s): AMMONIA in the last 168 hours. Coagulation Profile: Recent Labs  Lab 06/11/19 1014  INR 1.1   Cardiac Enzymes: No results for input(s): CKTOTAL, CKMB, CKMBINDEX, TROPONINI in the last 168 hours. BNP (last 3 results) No results for input(s): PROBNP in the last 8760 hours. HbA1C: No results for input(s): HGBA1C in the last 72 hours. CBG: No results for input(s): GLUCAP in the last 168 hours. Lipid Profile: No results for input(s): CHOL, HDL, LDLCALC, TRIG, CHOLHDL, LDLDIRECT in the last 72 hours. Thyroid Function Tests: No results for input(s): TSH, T4TOTAL, FREET4, T3FREE, THYROIDAB in the last 72 hours. Anemia Panel: No results for input(s): VITAMINB12, FOLATE, FERRITIN, TIBC,  IRON, RETICCTPCT in the last 72 hours. Urine analysis: No results found for: COLORURINE, APPEARANCEUR, LABSPEC, PHURINE, GLUCOSEU, HGBUR, BILIRUBINUR, KETONESUR, PROTEINUR, UROBILINOGEN, NITRITE, LEUKOCYTESUR  Radiological Exams on Admission: Dg Chest 1 View  Result Date: 06/11/2019 CLINICAL DATA:  Left hip fracture.  Preoperative exam. EXAM: CHEST  1 VIEW COMPARISON:  None. FINDINGS: Lungs are clear. Heart size is upper normal. No pneumothorax or pleural effusion. Calcified mediastinal lymph nodes noted. No acute or focal bony abnormality. IMPRESSION: No acute disease. Electronically Signed   By: Drusilla Kannerhomas  Dalessio M.D.   On: 06/11/2019 11:06   Dg Hip Unilat With Pelvis 2-3 Views Left  Result Date: 06/11/2019 CLINICAL DATA:  Hip deformity.  Fall. EXAM: DG HIP (WITH OR WITHOUT PELVIS) 2-3V LEFT COMPARISON:  None. FINDINGS: The patient is status post fracture of the proximal left femur extending from the proximal diaphysis into the intertrochanteric region.  No dislocation identified. IMPRESSION: Left hip fracture as above. Electronically Signed   By: Dorise Bullion III M.D   On: 06/11/2019 10:17    EKG: Independently reviewed.   Assessment/Plan Sheri Payne is a 85 y.o. female with medical history significant of glaucoma, mild to moderate dementia who presents after an unwitnessed fall at facility today and found to have an acute L. Hip fracture  # Traumatic fracture of L. Femur - patient with GLF, denies loc or trauma to head, evidence without abnormality to head and not on AC.  Plain film does reveal "fracture of the proximal L femur extending into the proximal diaphysis into the intertrochanteric region" - Case d/w Dr. Erlinda Hong, patient to be admitted to [facility] for operative management on 11/12 - NPO at midnight - continue pain control, PT/OT post-operative  # Glaucoma - continue eye gtts  # Dementia - delirium precautions  DVT prophylaxis: Lovenox Code Status: Full per patient's  wishes Family Communication: son, Legrand Como, notified over phone by Dr. Maryan Rued and Dr. Erlinda Hong of plan to admit to [facility] and surgery tomorrow c/w collective wishes of patient and son Disposition Plan: Admit to [facility] called: Orthopedic surgery, Dr. Erlinda Hong Admission status: inpatient   Truddie Hidden MD [facility] Pager [phone]  If 7PM-7AM, please contact night-coverage [email or web address] Password [email or web address]  06/11/2019, 1:39 PM

## 2019-06-11 NOTE — ED Triage Notes (Signed)
EMS reports from [facility], unwitnessed fall from standing, c/o left hip pain, no LOC, no blood thinners. Prior hip surgery.    112mcgm Fentanyl .4mg  Zofran enroute. 20ga LAC

## 2019-06-11 NOTE — ED Notes (Signed)
Sheri Payne [phone] (assisted living facility)

## 2019-06-12 ENCOUNTER — Inpatient Hospital Stay (HOSPITAL_COMMUNITY): Payer: Medicare Other | Admitting: Anesthesiology

## 2019-06-12 ENCOUNTER — Inpatient Hospital Stay (HOSPITAL_COMMUNITY): Payer: Medicare Other

## 2019-06-12 ENCOUNTER — Encounter (HOSPITAL_COMMUNITY)
Admission: EM | Disposition: A | Discharge status: Home / Self Care | Payer: Self-pay | Source: Skilled Nursing Facility | Attending: Internal Medicine

## 2019-06-12 ENCOUNTER — Encounter (HOSPITAL_COMMUNITY): Payer: Self-pay

## 2019-06-12 DIAGNOSIS — S72142A Displaced intertrochanteric fracture of left femur, initial encounter for closed fracture: Principal | ICD-10-CM

## 2019-06-12 HISTORY — PX: INTRAMEDULLARY (IM) NAIL INTERTROCHANTERIC: SHX5875

## 2019-06-12 LAB — TYPE AND SCREEN
ABO/RH(D): A POS
Antibody Screen: NEGATIVE

## 2019-06-12 LAB — MRSA PCR SCREENING: MRSA by PCR: NEGATIVE

## 2019-06-12 LAB — ABO/RH: ABO/RH(D): A POS

## 2019-06-12 SURGERY — FIXATION, FRACTURE, INTERTROCHANTERIC, WITH INTRAMEDULLARY ROD
Anesthesia: Spinal | Site: Hip | Laterality: Left

## 2019-06-12 MED ORDER — DOCUSATE SODIUM 100 MG PO CAPS
100.0000 mg | ORAL_CAPSULE | Freq: Two times a day (BID) | ORAL | Status: DC
  Administered 2019-06-12 – 2019-06-14 (×3): 100 mg via ORAL
  Filled 2019-06-12 (×4): qty 1

## 2019-06-12 MED ORDER — FENTANYL CITRATE (PF) 250 MCG/5ML IJ SOLN
INTRAMUSCULAR | Status: AC
  Filled 2019-06-12: qty 5

## 2019-06-12 MED ORDER — ENOXAPARIN SODIUM 40 MG/0.4ML ~~LOC~~ SOLN
40.0000 mg | SUBCUTANEOUS | Status: DC
  Administered 2019-06-13 – 2019-06-17 (×5): 40 mg via SUBCUTANEOUS
  Filled 2019-06-12 (×5): qty 0.4

## 2019-06-12 MED ORDER — HYDROCODONE-ACETAMINOPHEN 5-325 MG PO TABS
1.0000 | ORAL_TABLET | ORAL | Status: DC | PRN
  Administered 2019-06-13: 2 via ORAL
  Administered 2019-06-14 – 2019-06-16 (×5): 1 via ORAL
  Filled 2019-06-12: qty 2

## 2019-06-12 MED ORDER — SODIUM CHLORIDE 0.9 % IV SOLN
INTRAVENOUS | Status: DC
  Administered 2019-06-13: 16:00:00 via INTRAVENOUS

## 2019-06-12 MED ORDER — PROMETHAZINE HCL 25 MG/ML IJ SOLN: 6.2500 mg | INTRAMUSCULAR | Status: DC | PRN

## 2019-06-12 MED ORDER — METHOCARBAMOL 1000 MG/10ML IJ SOLN
500.0000 mg | Freq: Four times a day (QID) | INTRAVENOUS | Status: DC | PRN
  Filled 2019-06-12: qty 5

## 2019-06-12 MED ORDER — BRINZOLAMIDE 1 % OP SUSP
1.0000 [drp] | Freq: Two times a day (BID) | OPHTHALMIC | Status: DC
  Administered 2019-06-12 – 2019-06-17 (×10): 1 [drp] via OPHTHALMIC
  Filled 2019-06-12: qty 10

## 2019-06-12 MED ORDER — ONDANSETRON HCL 4 MG/2ML IJ SOLN
4.0000 mg | Freq: Four times a day (QID) | INTRAMUSCULAR | Status: DC | PRN
  Administered 2019-06-13: 4 mg via INTRAVENOUS
  Filled 2019-06-12: qty 2

## 2019-06-12 MED ORDER — MORPHINE SULFATE (PF) 2 MG/ML IV SOLN
1.0000 mg | INTRAVENOUS | Status: DC | PRN
  Administered 2019-06-12: 1 mg via INTRAVENOUS
  Filled 2019-06-12: qty 1

## 2019-06-12 MED ORDER — ENOXAPARIN SODIUM 40 MG/0.4ML ~~LOC~~ SOLN: 40.0000 mg | Freq: Every day | SUBCUTANEOUS | 13 refills | Status: DC

## 2019-06-12 MED ORDER — HYDROCODONE-ACETAMINOPHEN 7.5-325 MG PO TABS
1.0000 | ORAL_TABLET | ORAL | Status: DC | PRN
  Administered 2019-06-13: 1 via ORAL
  Filled 2019-06-12: qty 1

## 2019-06-12 MED ORDER — MENTHOL 3 MG MT LOZG: 1.0000 | LOZENGE | OROMUCOSAL | Status: DC | PRN

## 2019-06-12 MED ORDER — PHENYLEPHRINE HCL-NACL 10-0.9 MG/250ML-% IV SOLN
INTRAVENOUS | Status: DC | PRN
  Administered 2019-06-12: 50 ug/min via INTRAVENOUS

## 2019-06-12 MED ORDER — ACETAMINOPHEN 325 MG PO TABS: 325.0000 mg | ORAL_TABLET | Freq: Four times a day (QID) | ORAL | Status: DC | PRN

## 2019-06-12 MED ORDER — ONDANSETRON HCL 4 MG PO TABS: 4.0000 mg | ORAL_TABLET | Freq: Four times a day (QID) | ORAL | Status: DC | PRN

## 2019-06-12 MED ORDER — PROPOFOL 500 MG/50ML IV EMUL
INTRAVENOUS | Status: DC | PRN
  Administered 2019-06-12: 50 ug/kg/min via INTRAVENOUS

## 2019-06-12 MED ORDER — BUPIVACAINE IN DEXTROSE 0.75-8.25 % IT SOLN
INTRATHECAL | Status: DC | PRN
  Administered 2019-06-12: 1.4 mL via INTRATHECAL

## 2019-06-12 MED ORDER — PROPOFOL 10 MG/ML IV BOLUS
INTRAVENOUS | Status: DC | PRN
  Administered 2019-06-12: 10 mg via INTRAVENOUS
  Administered 2019-06-12: 11 mg via INTRAVENOUS

## 2019-06-12 MED ORDER — LIDOCAINE 2% (20 MG/ML) 5 ML SYRINGE
INTRAMUSCULAR | Status: DC | PRN
  Administered 2019-06-12: 40 mg via INTRAVENOUS

## 2019-06-12 MED ORDER — PHENOL 1.4 % MT LIQD: 1.0000 | OROMUCOSAL | Status: DC | PRN

## 2019-06-12 MED ORDER — LACTATED RINGERS IV SOLN
INTRAVENOUS | Status: DC
  Administered 2019-06-12: 21:00:00 via INTRAVENOUS
  Administered 2019-06-12: 1000 mL via INTRAVENOUS

## 2019-06-12 MED ORDER — HYDROCODONE-ACETAMINOPHEN 7.5-325 MG PO TABS: 1.0000 | ORAL_TABLET | Freq: Three times a day (TID) | ORAL | 0 refills | Status: AC | PRN

## 2019-06-12 MED ORDER — SORBITOL 70 % SOLN: 30.0000 mL | Freq: Every day | Status: DC | PRN

## 2019-06-12 MED ORDER — FENTANYL CITRATE (PF) 250 MCG/5ML IJ SOLN
INTRAMUSCULAR | Status: DC | PRN
  Administered 2019-06-12: 50 ug via INTRAVENOUS

## 2019-06-12 MED ORDER — ALUM & MAG HYDROXIDE-SIMETH 200-200-20 MG/5ML PO SUSP: 30.0000 mL | ORAL | Status: DC | PRN

## 2019-06-12 MED ORDER — MAGNESIUM CITRATE PO SOLN: 1.0000 | Freq: Once | ORAL | Status: DC | PRN

## 2019-06-12 MED ORDER — CEFAZOLIN SODIUM-DEXTROSE 2-4 GM/100ML-% IV SOLN
2.0000 g | Freq: Three times a day (TID) | INTRAVENOUS | Status: AC
  Administered 2019-06-12 – 2019-06-13 (×3): 2 g via INTRAVENOUS
  Filled 2019-06-12 (×4): qty 100

## 2019-06-12 MED ORDER — CEFAZOLIN SODIUM-DEXTROSE 2-4 GM/100ML-% IV SOLN
INTRAVENOUS | Status: AC
  Filled 2019-06-12: qty 100

## 2019-06-12 MED ORDER — BRIMONIDINE TARTRATE 0.2 % OP SOLN
1.0000 [drp] | Freq: Two times a day (BID) | OPHTHALMIC | Status: DC
  Administered 2019-06-12 – 2019-06-17 (×10): 1 [drp] via OPHTHALMIC
  Filled 2019-06-12: qty 5

## 2019-06-12 MED ORDER — FENTANYL CITRATE (PF) 100 MCG/2ML IJ SOLN: 25.0000 ug | INTRAMUSCULAR | Status: DC | PRN

## 2019-06-12 MED ORDER — ACETAMINOPHEN 500 MG PO TABS
500.0000 mg | ORAL_TABLET | Freq: Four times a day (QID) | ORAL | Status: AC
  Administered 2019-06-12: 500 mg via ORAL
  Filled 2019-06-12 (×2): qty 1

## 2019-06-12 MED ORDER — METHOCARBAMOL 500 MG PO TABS
500.0000 mg | ORAL_TABLET | Freq: Four times a day (QID) | ORAL | Status: DC | PRN
  Administered 2019-06-13 – 2019-06-14 (×2): 500 mg via ORAL
  Filled 2019-06-12 (×2): qty 1

## 2019-06-12 MED ORDER — ALBUTEROL SULFATE (2.5 MG/3ML) 0.083% IN NEBU: 3.0000 mL | INHALATION_SOLUTION | Freq: Three times a day (TID) | RESPIRATORY_TRACT | Status: DC | PRN

## 2019-06-12 MED ORDER — 0.9 % SODIUM CHLORIDE (POUR BTL) OPTIME
TOPICAL | Status: DC | PRN
  Administered 2019-06-12: 1000 mL

## 2019-06-12 MED ORDER — POTASSIUM CHLORIDE CRYS ER 20 MEQ PO TBCR
40.0000 meq | EXTENDED_RELEASE_TABLET | Freq: Once | ORAL | Status: AC
  Administered 2019-06-12: 40 meq via ORAL
  Filled 2019-06-12: qty 2

## 2019-06-12 MED ORDER — POLYETHYLENE GLYCOL 3350 17 G PO PACK: 17.0000 g | PACK | Freq: Every day | ORAL | Status: DC | PRN

## 2019-06-12 SURGICAL SUPPLY — 47 items
BIT DRILL SHORT 4.0 (BIT) ×1 IMPLANT
BNDG COHESIVE 4X5 TAN STRL (GAUZE/BANDAGES/DRESSINGS) ×3 IMPLANT
BNDG COHESIVE 6X5 TAN STRL LF (GAUZE/BANDAGES/DRESSINGS) IMPLANT
BNDG GAUZE ELAST 4 BULKY (GAUZE/BANDAGES/DRESSINGS) ×3 IMPLANT
COVER PERINEAL POST (MISCELLANEOUS) ×3 IMPLANT
COVER SURGICAL LIGHT HANDLE (MISCELLANEOUS) ×3 IMPLANT
COVER WAND RF STERILE (DRAPES) ×3 IMPLANT
DRAPE C-ARMOR (DRAPES) ×3 IMPLANT
DRAPE STERI IOBAN 125X83 (DRAPES) ×3 IMPLANT
DRILL BIT SHORT 4.0 (BIT) ×2
DRSG MEPILEX BORDER 4X4 (GAUZE/BANDAGES/DRESSINGS) ×3 IMPLANT
DRSG MEPILEX BORDER 4X8 (GAUZE/BANDAGES/DRESSINGS) ×3 IMPLANT
DRSG PAD ABDOMINAL 8X10 ST (GAUZE/BANDAGES/DRESSINGS) ×6 IMPLANT
DURAPREP 26ML APPLICATOR (WOUND CARE) ×3 IMPLANT
ELECT REM PT RETURN 9FT ADLT (ELECTROSURGICAL) ×3
ELECTRODE REM PT RTRN 9FT ADLT (ELECTROSURGICAL) ×1 IMPLANT
GLOVE BIOGEL PI IND STRL 7.0 (GLOVE) ×1 IMPLANT
GLOVE BIOGEL PI INDICATOR 7.0 (GLOVE) ×2
GLOVE ECLIPSE 7.0 STRL STRAW (GLOVE) ×3 IMPLANT
GLOVE SKINSENSE NS SZ7.5 (GLOVE) ×4
GLOVE SKINSENSE STRL SZ7.5 (GLOVE) ×2 IMPLANT
GOWN STRL REIN XL XLG (GOWN DISPOSABLE) ×3 IMPLANT
GUIDE PIN 3.2X343 (PIN) ×2
GUIDE PIN 3.2X343MM (PIN) ×4
GUIDE ROD 3.0 (MISCELLANEOUS) ×3
KIT BASIN OR (CUSTOM PROCEDURE TRAY) ×3 IMPLANT
KIT TURNOVER KIT B (KITS) ×3 IMPLANT
MANIFOLD NEPTUNE II (INSTRUMENTS) ×3 IMPLANT
NAIL TRIGEN LEFT 10X38-125 (Nail) ×3 IMPLANT
NS IRRIG 1000ML POUR BTL (IV SOLUTION) ×3 IMPLANT
PACK GENERAL/GYN (CUSTOM PROCEDURE TRAY) ×3 IMPLANT
PAD ARMBOARD 7.5X6 YLW CONV (MISCELLANEOUS) ×6 IMPLANT
PAD CAST 4YDX4 CTTN HI CHSV (CAST SUPPLIES) ×2 IMPLANT
PADDING CAST COTTON 4X4 STRL (CAST SUPPLIES) ×4
PIN GUIDE 3.2X343MM (PIN) ×2 IMPLANT
ROD GUIDE 3.0 (MISCELLANEOUS) ×1 IMPLANT
SCREW LAG COMPR KIT 95/90 (Screw) ×3 IMPLANT
SCREW TRIGEN LOW PROF 5.0X42.5 (Screw) ×3 IMPLANT
STAPLER VISISTAT 35W (STAPLE) ×3 IMPLANT
SUT VIC AB 0 CT1 27 (SUTURE) ×4
SUT VIC AB 0 CT1 27XBRD ANBCTR (SUTURE) ×2 IMPLANT
SUT VIC AB 2-0 CT1 27 (SUTURE) ×2
SUT VIC AB 2-0 CT1 TAPERPNT 27 (SUTURE) ×1 IMPLANT
TOWEL GREEN STERILE (TOWEL DISPOSABLE) ×3 IMPLANT
TOWEL GREEN STERILE FF (TOWEL DISPOSABLE) ×3 IMPLANT
TRAY FOLEY W/BAG SLVR 14FR (SET/KITS/TRAYS/PACK) ×3 IMPLANT
WATER STERILE IRR 1000ML POUR (IV SOLUTION) ×3 IMPLANT

## 2019-06-12 NOTE — Transfer of Care (Signed)
Immediate Anesthesia Transfer of Care Note  Patient: Sheri Payne  Procedure(s) Performed: INTRAMEDULLARY (IM) NAIL INTERTROCHANTRIC (Left Hip)  Patient Location: PACU  Anesthesia Type:Spinal  Level of Consciousness: awake and confused  Airway & Oxygen Therapy: Patient Spontanous Breathing, Patient connected to nasal cannula oxygen and Patient connected to face mask oxygen  Post-op Assessment: Report given to RN and Post -op Vital signs reviewed and stable  Post vital signs: Reviewed and stable  Last Vitals:  Vitals Value Taken Time  BP 93/69 06/12/19 1617  Temp    Pulse 37 06/12/19 1620  Resp 20 06/12/19 1621  SpO2 87 % 06/12/19 1620  Vitals shown include unvalidated device data.  Last Pain:  Vitals:   06/12/19 0840  TempSrc: Oral  PainSc:       Patients Stated Pain Goal: 1 (04/59/97 7414)  Complications: No apparent anesthesia complications

## 2019-06-12 NOTE — ED Notes (Signed)
ED TO INPATIENT HANDOFF REPORT  ED Nurse Name and Phone #: Shilo Pauwels    S Name/Age/Gender Sheri Payne 85 y.o. female Room/Bed: WOTF/NONE  Code Status   Code Status: Full Code  Home/SNF/Other [place] [address] Patient oriented to: self, place and situation Is this baseline? Yes   Triage Complete: Triage complete  Chief Complaint fall/hip pain  Triage Note EMS reports from [facility], unwitnessed fall from standing, c/o left hip pain, no LOC, no blood thinners. Prior hip surgery.    180mcgm Fentanyl .4mg  Zofran enroute. 20ga LAC     Allergies Allergies  Allergen Reactions  . Sulfa Antibiotics Rash    Other reaction(s): Intolerance    Level of Care/Admitting Diagnosis ED Disposition    ED Disposition Condition [facility] Area: [place] [[id number]]  Level of Care: Med-Surg [16]  Covid Evaluation: Asymptomatic Screening Protocol (No Symptoms)  Diagnosis: Hip fracture [facility]) [[id number]]  Admitting Physician: Truddie Hidden [[id number]]  Attending Physician: Truddie Hidden [[id number]]  Estimated length of stay: 3 - 4 days  Certification:: I certify this patient will need inpatient services for at least 2 midnights  PT Class (Do Not Modify): Inpatient [101]  PT Acc Code (Do Not Modify): Private [1]       B Medical/Surgery History Past Medical History:  Diagnosis Date  . Glaucoma    Past Surgical History:  Procedure Laterality Date  . APPENDECTOMY    . EYE SURGERY Right    pt unsure what type of eye surgery but states in right eye     A IV Location/Drains/Wounds Patient Lines/Drains/Airways Status   Active Line/Drains/Airways    Name:   Placement date:   Placement time:   Site:   Days:   Peripheral IV 06/11/19 Anterior;Left;Proximal Forearm   06/11/19    0943    Forearm   1          Intake/Output Last 24 hours  Intake/Output Summary (Last 24 hours) at 06/12/2019 0201 Last data filed at 06/11/2019 2052 Gross per 24  hour  Intake 476.62 ml  Output -  Net 476.62 ml    Labs/Imaging Results for orders placed or performed during the hospital encounter of 06/11/19 (from the past 48 hour(s))  Basic metabolic panel     Status: Abnormal   Collection Time: 06/11/19 10:14 AM  Result Value Ref Range   Sodium 138 135 - 145 mmol/L   Potassium 3.1 (L) 3.5 - 5.1 mmol/L   Chloride 107 98 - 111 mmol/L   CO2 22 22 - 32 mmol/L   Glucose, Bld 178 (H) 70 - 99 mg/dL   BUN 10 8 - 23 mg/dL   Creatinine, Ser 0.84 0.44 - 1.00 mg/dL   Calcium 8.7 (L) 8.9 - 10.3 mg/dL   GFR calc non Af Amer >60 >60 mL/min   GFR calc Af Amer >60 >60 mL/min   Anion gap 9 5 - 15    Comment: Performed at [facility], [place] [address] [place], [place] [address]  CBC WITH DIFFERENTIAL     Status: Abnormal   Collection Time: 06/11/19 10:14 AM  Result Value Ref Range   WBC 12.4 (H) 4.0 - 10.5 K/uL   RBC 4.15 3.87 - 5.11 MIL/uL   Hemoglobin 13.0 12.0 - 15.0 g/dL   HCT 39.5 36.0 - 46.0 %   MCV 95.2 80.0 - 100.0 fL   MCH 31.3 26.0 - 34.0 pg   MCHC 32.9 30.0 - 36.0 g/dL  RDW 13.3 11.5 - 15.5 %   Platelets 242 150 - 400 K/uL   nRBC 0.0 0.0 - 0.2 %   Neutrophils Relative % 65 %   Neutro Abs 8.1 (H) 1.7 - 7.7 K/uL   Lymphocytes Relative 25 %   Lymphs Abs 3.1 0.7 - 4.0 K/uL   Monocytes Relative 7 %   Monocytes Absolute 0.9 0.1 - 1.0 K/uL   Eosinophils Relative 1 %   Eosinophils Absolute 0.1 0.0 - 0.5 K/uL   Basophils Relative 1 %   Basophils Absolute 0.1 0.0 - 0.1 K/uL   Immature Granulocytes 1 %   Abs Immature Granulocytes 0.17 (H) 0.00 - 0.07 K/uL    Comment: Performed at [facility], [address] [place], [place] [address]  Protime-INR     Status: None   Collection Time: 06/11/19 10:14 AM  Result Value Ref Range   Prothrombin Time 14.2 11.4 - 15.2 seconds   INR 1.1 0.8 - 1.2    Comment: (NOTE) INR goal varies based on device and disease states. Performed at [facility],  [address] [place], [place] [address]   Type and screen [facility]     Status: None   Collection Time: 06/11/19 10:14 AM  Result Value Ref Range   ABO/RH(D) A POS    Antibody Screen NEG    Sample Expiration      06/14/2019,2359 Performed at [facility], [address] [place], [place] [address]   ABO/Rh     Status: None   Collection Time: 06/11/19 10:15 AM  Result Value Ref Range   ABO/RH(D)      A POS Performed at [facility], [address] [place], [place] [address]   SARS CORONAVIRUS 2 (TAT 6-24 HRS) Nasopharyngeal Nasopharyngeal Swab     Status: None   Collection Time: 06/11/19  1:20 PM   Specimen: Nasopharyngeal Swab  Result Value Ref Range   SARS Coronavirus 2 NEGATIVE NEGATIVE    Comment: (NOTE) SARS-CoV-2 target nucleic acids are NOT DETECTED. The SARS-CoV-2 RNA is generally detectable in upper and lower respiratory specimens during the acute phase of infection. Negative results do not preclude SARS-CoV-2 infection, do not rule out co-infections with other pathogens, and should not be used as the sole basis for treatment or other patient management decisions. Negative results must be combined with clinical observations, patient history, and epidemiological information. The expected result is Negative. Fact Sheet for Patients: HairSlick.nohttps://www.fda.gov/media/138098/download Fact Sheet for Healthcare Providers: quierodirigir.comhttps://www.fda.gov/media/138095/download This test is not yet approved or cleared by the [place] FDA and  has been authorized for detection and/or diagnosis of SARS-CoV-2 by FDA under an Emergency Use Authorization (EUA). This EUA will remain  in effect (meaning this test can be used) for the duration of the COVID-19 declaration under Section 56 4(b)(1) of the Act, 21 U.S.C. section 360bbb-3(b)(1), unless the authorization is terminated or revoked sooner. Performed at [facility], [address] [place], [place] [address]    Dg Chest 1 View  Result Date: 06/11/2019 CLINICAL DATA:  Left hip fracture.  Preoperative exam. EXAM: CHEST  1 VIEW COMPARISON:  None. FINDINGS: Lungs are clear. Heart size is upper normal. No pneumothorax or pleural effusion. Calcified mediastinal lymph nodes noted. No acute or focal bony abnormality. IMPRESSION: No acute disease. Electronically Signed   By: Drusilla Kannerhomas  Dalessio M.D.   On: 06/11/2019 11:06   Dg Hip Unilat With Pelvis 2-3 Views  Left  Result Date: 06/11/2019 CLINICAL DATA:  Hip deformity.  Fall. EXAM: DG HIP (WITH OR WITHOUT PELVIS) 2-3V LEFT COMPARISON:  None. FINDINGS: The patient is status post fracture of the proximal left femur extending from the proximal diaphysis into the intertrochanteric region. No dislocation identified. IMPRESSION: Left hip fracture as above. Electronically Signed   By: Gerome Sam III M.D   On: 06/11/2019 10:17    Pending Labs Unresulted Labs (From admission, onward)    Start     Ordered   06/12/19 0500  Basic metabolic panel  Tomorrow morning,   R     06/11/19 2049   06/12/19 0500  CBC  Tomorrow morning,   R     06/11/19 2049          Vitals/Pain Today's Vitals   06/11/19 2042 06/12/19 0155 06/12/19 0158 06/12/19 0158  BP: (!) 150/80   (!) 154/86  Pulse: 82  80   Resp: (!) 25  20   Temp:      TempSrc:      SpO2: 96%   95%  PainSc: 0-No pain 8       Isolation Precautions No active isolations  Medications Medications  feeding supplement (ENSURE PRE-SURGERY) liquid 296 mL (has no administration in time range)  povidone-iodine 10 % swab 2 application (has no administration in time range)  ceFAZolin (ANCEF) IVPB 2g/100 mL premix (has no administration in time range)  tranexamic acid (CYKLOKAPRON) IVPB 1,000 mg (has no administration in time range)  tranexamic acid (CYKLOKAPRON) 2,000 mg in sodium chloride 0.9 % 50 mL Topical Application (has no administration in time range)   potassium chloride 10 mEq in 100 mL IVPB (0 mEq Intravenous Stopped 06/11/19 2052)  acetaminophen (TYLENOL) tablet 500 mg (has no administration in time range)  aspirin EC tablet 81 mg (has no administration in time range)  polyethylene glycol (MIRALAX / GLYCOLAX) packet 17 g (has no administration in time range)  multivitamin with minerals tablet (has no administration in time range)  albuterol (VENTOLIN HFA) 108 (90 Base) MCG/ACT inhaler 2 puff (has no administration in time range)  guaiFENesin (ROBITUSSIN) 100 MG/5ML solution 200 mg (has no administration in time range)  montelukast (SINGULAIR) tablet 10 mg (10 mg Oral Given 06/12/19 0132)  NON FORMULARY 1 drop (has no administration in time range)  latanoprost (XALATAN) 0.005 % ophthalmic solution 1 drop (has no administration in time range)  HYDROcodone-acetaminophen (NORCO/VICODIN) 5-325 MG per tablet 1-2 tablet (2 tablets Oral Given 06/12/19 0146)  0.9 %  sodium chloride infusion (has no administration in time range)  senna-docusate (Senokot-S) tablet 1 tablet (has no administration in time range)  0.9 %  sodium chloride infusion (1,000 mLs Intravenous New Bag/Given 06/11/19 1428)    Mobility Non ambulatory due to hip fracture  Low fall risk   Focused Assessments Cardiac Assessment Handoff:    No results found for: CKTOTAL, CKMB, CKMBINDEX, TROPONINI No results found for: DDIMER Does the Patient currently have chest pain? No   , Pulmonary Assessment Handoff:  Lung sounds:   O2 Device: Room Air        R Recommendations: See Admitting Provider Note  Report given to: lauren RN   Additional Notes:

## 2019-06-12 NOTE — Anesthesia Procedure Notes (Signed)
Spinal  Patient location during procedure: OR Start time: 06/12/2019 2:58 PM End time: 06/12/2019 3:05 PM Staffing Anesthesiologist: Duane Boston, MD Performed: anesthesiologist  Preanesthetic Checklist Completed: patient identified, surgical consent, pre-op evaluation, timeout performed, IV checked, risks and benefits discussed and monitors and equipment checked Spinal Block Patient position: sitting Prep: DuraPrep Patient monitoring: cardiac monitor, continuous pulse ox and blood pressure Approach: midline Location: L2-3 Injection technique: single-shot Needle Needle type: Pencan  Needle gauge: 24 G Needle length: 9 cm Additional Notes Functioning IV was confirmed and monitors were applied. Sterile prep and drape, including hand hygiene and sterile gloves were used. The patient was positioned and the spine was prepped. The skin was anesthetized with lidocaine.  Free flow of clear CSF was obtained prior to injecting local anesthetic into the CSF.  The spinal needle aspirated freely following injection.  The needle was carefully withdrawn.  The patient tolerated the procedure well.

## 2019-06-12 NOTE — Progress Notes (Signed)
OT Cancellation Note  Patient Details Name: Harsimran Westman MRN: [id number] DOB: 1926/01/10   Cancelled Treatment:    Reason Eval/Treat Not Completed: Active bedrest order;Other (comment). Pt with current bed rest orders in place and in [facility] Traction with plans for surgery. Plan to reattempt once pt medically cleared for increased activity.   Tyrone Schimke, OT Acute Rehabilitation Services Pager: [phone] Office: [phone]  06/12/2019, 8:57 AM

## 2019-06-12 NOTE — Progress Notes (Signed)
Orthopedic Tech Progress Note Patient Details:  Sheri Payne 1926-05-05 [id number]  Musculoskeletal Traction Type of Traction: [place] Skin Traction Traction Weight: 10 lbs       Melony Overly T 06/12/2019, 3:33 AM

## 2019-06-12 NOTE — Op Note (Signed)
° °  Date of Surgery: 06/12/2019  INDICATIONS: Ms. Sheri Payne is a 85 y.o.-year-old female who sustained a left hip fracture. The risks and benefits of the procedure discussed with the patient and family prior to the procedure and all questions were answered; consent was obtained.  PREOPERATIVE DIAGNOSIS: left intertrochanteric hip fracture   POSTOPERATIVE DIAGNOSIS: Same   PROCEDURE: Open treatment of intertrochanteric fracture with intramedullary implant. CPT [id number]   SURGEON: N. Eduard Roux, M.D.   ASSIST: Ciro Backer [place], [place]; necessary for the timely completion of procedure and due to complexity of procedure.  ANESTHESIA: general   IV FLUIDS AND URINE: See anesthesia record   ESTIMATED BLOOD LOSS: 150 cc  IMPLANTS: [employer] InterTAN 10 x 38, 95/90 lag screws  DRAINS: None.   COMPLICATIONS: see description of procedure.   DESCRIPTION OF PROCEDURE: The patient was brought to the operating room and placed supine on the operating table. [name] leg had been signed prior to the procedure. The patient had the anesthesia placed by the anesthesiologist. The prep verification and incision time-outs were performed to confirm that this was the correct patient, site, side and location. The patient had an SCD on the opposite lower extremity. The patient did receive antibiotics prior to the incision and was re-dosed during the procedure as needed at indicated intervals. The patient was positioned on the fracture table with the table in traction and internal rotation to reduce the hip. The well leg was placed in a scissor position and all bony prominences were well-padded. The patient had the lower extremity prepped and draped in the standard surgical fashion. The incision was made 4 finger breadths superior to the greater trochanter. A guide pin was inserted into the tip of the greater trochanter under fluoroscopic guidance. An opening reamer was used to gain access to the femoral  canal. The nail length was measured and inserted down the femoral canal to its proper depth. The appropriate version of insertion for the lag screw was found under fluoroscopy. A pin was inserted up the femoral neck through the jig. Then, a second antirotation pin was inserted inferior to the first pin. The length of the lag screw was then measured. The lag screw was inserted as near to center-center in the head as possible. The antirotation pin was then taken out and an interdigitating compression screw was placed in its place. The leg was taken out of traction, then the interdigitating compression screw was used to compress across the fracture. Compression was visualized on serial xrays.  A distal interlocking screw was placed using the perfect circle technique.  The wound was copiously irrigated with saline and the subcutaneous layer closed with 2.0 vicryl and the skin was reapproximated with staples. The wounds were cleaned and dried a final time and a sterile dressing was placed. The hip was taken through a range of motion at the end of the case under fluoroscopic imaging to visualize the approach-withdraw phenomenon and confirm implant length in the head. The patient was then awakened from anesthesia and taken to the recovery room in stable condition. All counts were correct at the end of the case.   POSTOPERATIVE PLAN: The patient will be weight bearing as tolerated and will return in 2 weeks for staple removal and the patient will receive DVT prophylaxis based on other medications, activity level, and risk ratio of bleeding to thrombosis.   Sheri Cecil, MD [facility] 3:52 PM

## 2019-06-12 NOTE — Progress Notes (Signed)
PT Cancellation Note  Patient Details Name: Sheri Payne MRN: [id number] DOB: 08-18-1925   Cancelled Treatment:    Reason Eval/Treat Not Completed: Active bedrest order. Pt with current bed rest orders in place and in [facility] Traction with plans for surgery. PT will continue to follow acutely and await updated activity orders prior to initiating evaluation.    Clearnce Sorrel Filimon Miranda 06/12/2019, 7:40 AM

## 2019-06-12 NOTE — Anesthesia Procedure Notes (Signed)
Procedure Name: MAC Date/Time: 06/12/2019 2:54 PM Performed by: Mariea Clonts, CRNA Pre-anesthesia Checklist: Patient identified, Emergency Drugs available, Suction available, Patient being monitored and Timeout performed Patient Re-evaluated:Patient Re-evaluated prior to induction Oxygen Delivery Method: Simple face mask and Nasal cannula

## 2019-06-12 NOTE — Plan of Care (Signed)
  Problem: Education: Goal: Knowledge of General Education information will improve Description Including pain rating scale, medication(s)/side effects and non-pharmacologic comfort measures Outcome: Progressing   Problem: Health Behavior/Discharge Planning: Goal: Ability to manage health-related needs will improve Outcome: Progressing   

## 2019-06-12 NOTE — Anesthesia Postprocedure Evaluation (Signed)
Anesthesia Post Note  Patient: Sheri Payne  Procedure(s) Performed: INTRAMEDULLARY (IM) NAIL INTERTROCHANTRIC (Left Hip)     Patient location during evaluation: PACU Anesthesia Type: Spinal Level of consciousness: awake and alert Pain management: pain level controlled Vital Signs Assessment: post-procedure vital signs reviewed and stable Respiratory status: spontaneous breathing and respiratory function stable Cardiovascular status: blood pressure returned to baseline and stable Postop Assessment: spinal receding Anesthetic complications: no    Last Vitals:  Vitals:   06/12/19 1732 06/12/19 1747  BP: 134/78 136/71  Pulse: 96 94  Resp: (!) 24 10  Temp:    SpO2: 100% 100%    Last Pain:  Vitals:   06/12/19 1617  TempSrc:   PainSc: Home

## 2019-06-12 NOTE — Discharge Instructions (Signed)
° ° °  1. Change dressings as needed °2. May shower but keep incisions covered and dry °3. Take lovenox to prevent blood clots °4. Take stool softeners as needed °5. Take pain meds as needed ° °

## 2019-06-12 NOTE — Progress Notes (Addendum)
PROGRESS NOTE    Sheri Payne  OZH:[id number] DOB: [birth year] DOA: 06/11/2019 PCP: System, Pcp Not In   Brief Narrative:  Per HPI: Sheri Payne is a 85 y.o. female with medical history significant of glaucoma, mild to moderate dementia who presents after an unwitnessed fall at facility today.  Patient reports she had an unwitnessed fall this morning, while trying to get to her restroom.  She states she was by herself.  She denies any preceding symptoms, no chest pain, no palpitations, no loss of consciousness and states she recalls the event though is unaware if she had eaten this morning.  She denies any numbness or sensory deficits.  She believes she just tripped and fell.  She denies any other symptoms.  She states she has not had any fevers, chills, nausea/vomiting, states she is eating well and having normal bowel movements.  She presently reports her L hip pain is well controlled.  11/12: Ortho plans for ORIF to L hip this afternoon. Pain controlled.  Assessment & Plan:   Principal Problem:   Displaced intertrochanteric fracture of left femur, initial encounter for closed fracture [facility]) Active Problems:   Hip fracture (HCC)   Traumatic fracture of L. Femur - patient with GLF, denies loc or trauma to head, evidence without abnormality to head and not on AC.  Plain film does reveal "fracture of the proximal L femur extending into the proximal diaphysis into the intertrochanteric region" - Orthopedics for surgery this afternoon -Continue NPO, avoid Lovenox  Hypokalemia -Replete and re-evaluate in am  Glaucoma - continue eye gtts  Dementia - delirium precautions   DVT prophylaxis:Lovenox Code Status: Full Family Communication: None at bedside Disposition Plan: Plan for surgery per Ortho this afternoon.   Consultants:   Orthopedics  Procedures:   None  Antimicrobials:  Anti-infectives (From admission, onward)   Start     Dose/Rate Route Frequency  Ordered Stop   06/12/19 1400  ceFAZolin (ANCEF) IVPB 2g/100 mL premix     2 g 200 mL/hr over 30 Minutes Intravenous On call to O.R. 06/11/19 2049 06/13/19 0559       Subjective: Patient seen and evaluated today with no new acute complaints or concerns. No acute concerns or events noted overnight.  Objective: Vitals:   06/12/19 0158 06/12/19 0158 06/12/19 0230 06/12/19 0840  BP:  (!) 154/86 136/71 134/70  Pulse: 80  79 85  Resp: 20  19 15   Temp:   98.4 F (36.9 C) 97.7 F (36.5 C)  TempSrc:   Oral Oral  SpO2:  95% 98% 96%    Intake/Output Summary (Last 24 hours) at 06/12/2019 1121 Last data filed at 06/11/2019 2052 Gross per 24 hour  Intake 476.62 ml  Output -  Net 476.62 ml   There were no vitals filed for this visit.  Examination:  General exam: Appears calm and comfortable  Respiratory system: Clear to auscultation. Respiratory effort normal. Cardiovascular system: S1 & S2 heard, RRR. No JVD, murmurs, rubs, gallops or clicks. No pedal edema. Gastrointestinal system: Abdomen is nondistended, soft and nontender. No organomegaly or masses felt. Normal bowel sounds heard. Central nervous system: Alert and oriented. No focal neurological deficits. Extremities: Symmetric 5 x 5 power. Skin: No rashes, lesions or ulcers Psychiatry: Judgement and insight appear normal. Mood & affect appropriate.     Data Reviewed: I have personally reviewed following labs and imaging studies  CBC: Recent Labs  Lab 06/11/19 1014  WBC 12.4*  NEUTROABS 8.1*  HGB 13.0  HCT  39.5  MCV 95.2  PLT 242   Basic Metabolic Panel: Recent Labs  Lab 06/11/19 1014  NA 138  K 3.1*  CL 107  CO2 22  GLUCOSE 178*  BUN 10  CREATININE 0.84  CALCIUM 8.7*   GFR: CrCl cannot be calculated (Unknown ideal weight.). Liver Function Tests: No results for input(s): AST, ALT, ALKPHOS, BILITOT, PROT, ALBUMIN in the last 168 hours. No results for input(s): LIPASE, AMYLASE in the last 168 hours. No  results for input(s): AMMONIA in the last 168 hours. Coagulation Profile: Recent Labs  Lab 06/11/19 1014  INR 1.1   Cardiac Enzymes: No results for input(s): CKTOTAL, CKMB, CKMBINDEX, TROPONINI in the last 168 hours. BNP (last 3 results) No results for input(s): PROBNP in the last 8760 hours. HbA1C: No results for input(s): HGBA1C in the last 72 hours. CBG: No results for input(s): GLUCAP in the last 168 hours. Lipid Profile: No results for input(s): CHOL, HDL, LDLCALC, TRIG, CHOLHDL, LDLDIRECT in the last 72 hours. Thyroid Function Tests: No results for input(s): TSH, T4TOTAL, FREET4, T3FREE, THYROIDAB in the last 72 hours. Anemia Panel: No results for input(s): VITAMINB12, FOLATE, FERRITIN, TIBC, IRON, RETICCTPCT in the last 72 hours. Sepsis Labs: No results for input(s): PROCALCITON, LATICACIDVEN in the last 168 hours.  Recent Results (from the past 240 hour(s))  SARS CORONAVIRUS 2 (TAT 6-24 HRS) Nasopharyngeal Nasopharyngeal Swab     Status: None   Collection Time: 06/11/19  1:20 PM   Specimen: Nasopharyngeal Swab  Result Value Ref Range Status   SARS Coronavirus 2 NEGATIVE NEGATIVE Final    Comment: (NOTE) SARS-CoV-2 target nucleic acids are NOT DETECTED. The SARS-CoV-2 RNA is generally detectable in upper and lower respiratory specimens during the acute phase of infection. Negative results do not preclude SARS-CoV-2 infection, do not rule out co-infections with other pathogens, and should not be used as the sole basis for treatment or other patient management decisions. Negative results must be combined with clinical observations, patient history, and epidemiological information. The expected result is Negative. Fact Sheet for Patients: HairSlick.nohttps://www.fda.gov/media/138098/download Fact Sheet for Healthcare Providers: quierodirigir.comhttps://www.fda.gov/media/138095/download This test is not yet approved or cleared by the [place] FDA and  has been authorized for detection and/or  diagnosis of SARS-CoV-2 by FDA under an Emergency Use Authorization (EUA). This EUA will remain  in effect (meaning this test can be used) for the duration of the COVID-19 declaration under Section 56 4(b)(1) of the Act, 21 U.S.C. section 360bbb-3(b)(1), unless the authorization is terminated or revoked sooner. Performed at [facility], [address] [place], [place] [address]   MRSA PCR Screening     Status: None   Collection Time: 06/12/19  2:42 AM   Specimen: Nasal Mucosa; Nasopharyngeal  Result Value Ref Range Status   MRSA by PCR NEGATIVE NEGATIVE Final    Comment:        The GeneXpert MRSA Assay (FDA approved for NASAL specimens only), is one component of a comprehensive MRSA colonization surveillance program. It is not intended to diagnose MRSA infection nor to guide or monitor treatment for MRSA infections. Performed at [facility], [address] [place], [place] [address]          Radiology Studies: Dg Chest 1 View  Result Date: 06/11/2019 CLINICAL DATA:  Left hip fracture.  Preoperative exam. EXAM: CHEST  1 VIEW COMPARISON:  None. FINDINGS: Lungs are clear. Heart size is upper normal. No pneumothorax or pleural effusion. Calcified mediastinal lymph nodes  noted. No acute or focal bony abnormality. IMPRESSION: No acute disease. Electronically Signed   By: Drusilla Kanner M.D.   On: 06/11/2019 11:06   Dg Hip Unilat With Pelvis 2-3 Views Left  Result Date: 06/11/2019 CLINICAL DATA:  Hip deformity.  Fall. EXAM: DG HIP (WITH OR WITHOUT PELVIS) 2-3V LEFT COMPARISON:  None. FINDINGS: The patient is status post fracture of the proximal left femur extending from the proximal diaphysis into the intertrochanteric region. No dislocation identified. IMPRESSION: Left hip fracture as above. Electronically Signed   By: Gerome Sam III M.D   On: 06/11/2019 10:17        Scheduled Meds: . aspirin EC  81 mg Oral Daily  . brinzolamide  1 drop Right Eye  BID   And  . brimonidine  1 drop Right Eye BID  . latanoprost  1 drop Both Eyes QHS  . montelukast  10 mg Oral QHS  . multivitamin with minerals   Oral Daily  . povidone-iodine  2 application Topical Once  . tranexamic acid (CYKLOKAPRON) topical -INTRAOP  2,000 mg Topical To OR   Continuous Infusions: . sodium chloride 1,000 mL (06/11/19 1428)  .  ceFAZolin (ANCEF) IV    . tranexamic acid       LOS: 1 day    Time spent: 30 minutes     Hoover Brunette, DO [facility] Pager [phone]  If 7PM-7AM, please contact night-coverage [email or web address] Password [facility] 06/12/2019, 11:21 AM

## 2019-06-12 NOTE — Anesthesia Preprocedure Evaluation (Signed)
Anesthesia Evaluation  Patient identified by MRN, date of birth, ID band Patient awake    Reviewed: Allergy & Precautions, NPO status , Patient's Chart, lab work & pertinent test results  Airway Mallampati: II  TM Distance: >3 FB Neck ROM: Full    Dental no notable dental hx. (+) Dental Advisory Given   Pulmonary asthma , former smoker,    Pulmonary exam normal        Cardiovascular negative cardio ROS Normal cardiovascular exam     Neuro/Psych negative neurological ROS  negative psych ROS   GI/Hepatic negative GI ROS, Neg liver ROS,   Endo/Other  negative endocrine ROS  Renal/GU negative Renal ROS  negative genitourinary   Musculoskeletal   Abdominal   Peds negative pediatric ROS (+)  Hematology negative hematology ROS (+)   Anesthesia Other Findings   Reproductive/Obstetrics negative OB ROS                             Anesthesia Physical Anesthesia Plan  ASA: III  Anesthesia Plan: Spinal   Post-op Pain Management:    Induction:   PONV Risk Score and Plan: Ondansetron and Propofol infusion  Airway Management Planned: Natural Airway  Additional Equipment:   Intra-op Plan:   Post-operative Plan:   Informed Consent: I have reviewed the patients History and Physical, chart, labs and discussed the procedure including the risks, benefits and alternatives for the proposed anesthesia with the patient or authorized representative who has indicated his/her understanding and acceptance.     Dental advisory given  Plan Discussed with: Anesthesiologist and Surgeon  Anesthesia Plan Comments:         Anesthesia Quick Evaluation

## 2019-06-13 ENCOUNTER — Encounter (HOSPITAL_COMMUNITY): Payer: Self-pay | Admitting: Orthopaedic Surgery

## 2019-06-13 LAB — CBC
HCT: 31.8 % — ABNORMAL LOW (ref 36.0–46.0)
Hemoglobin: 10.5 g/dL — ABNORMAL LOW (ref 12.0–15.0)
MCH: 31.7 pg (ref 26.0–34.0)
MCHC: 33 g/dL (ref 30.0–36.0)
MCV: 96.1 fL (ref 80.0–100.0)
Platelets: 127 10*3/uL — ABNORMAL LOW (ref 150–400)
RBC: 3.31 MIL/uL — ABNORMAL LOW (ref 3.87–5.11)
RDW: 13.1 % (ref 11.5–15.5)
WBC: 10.6 10*3/uL — ABNORMAL HIGH (ref 4.0–10.5)
nRBC: 0 % (ref 0.0–0.2)

## 2019-06-13 LAB — BASIC METABOLIC PANEL
Anion gap: 10 (ref 5–15)
BUN: 9 mg/dL (ref 8–23)
CO2: 18 mmol/L — ABNORMAL LOW (ref 22–32)
Calcium: 7.9 mg/dL — ABNORMAL LOW (ref 8.9–10.3)
Chloride: 107 mmol/L (ref 98–111)
Creatinine, Ser: 0.68 mg/dL (ref 0.44–1.00)
GFR calc Af Amer: >60 mL/min (ref >60)
GFR calc non Af Amer: >60 mL/min (ref >60)
Glucose, Bld: 116 mg/dL — ABNORMAL HIGH (ref 70–99)
Potassium: 3.9 mmol/L (ref 3.5–5.1)
Sodium: 135 mmol/L (ref 135–145)

## 2019-06-13 LAB — MAGNESIUM: Magnesium: 1.8 mg/dL (ref 1.7–2.4)

## 2019-06-13 NOTE — Progress Notes (Signed)
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

## 2019-06-13 NOTE — Evaluation (Signed)
Occupational Therapy Evaluation Patient Details Name: Sheri Payne MRN: [id number] DOB: [birth year] Today's Date: 06/13/2019    History of Present Illness Pt is a 85 y/o F admitted on 06/11/2019 after an unwittness fall at a facility & found to have an acute L hip fx. Pt underwent LLE IM nail placement on 06/12/2019.  Pt with PMH significant for glaucoma & dementia.   Clinical Impression   Pt admitted with the above diagnoses and presents with below problem list. Pt will benefit from continued acute OT to address the below listed deficits and maximize independence with basic ADLs prior to d/c to venue below. PTA pt was residing in memory care unit. Per son, pt did not use AD to ambulate. Pt limited by pain and some nausea this session with associated increase in agitation. Son present throughout session and encouraging pt. Pt came from supine to long-sitting position EOB with +2 total A. Pt actively resisting coming to full EOB position this session. Returned pt to supine.      Follow Up Recommendations  Other (comment);SNF(back to [facility] if able to provide SNF level services)    Equipment Recommendations  Other (comment)(defer to next venue)    Recommendations for Other Services       Precautions / Restrictions Precautions Precautions: Fall Precaution Comments: Pt has been resident of memory care unit for several years per her son. Restrictions Weight Bearing Restrictions: Yes LLE Weight Bearing: Weight bearing as tolerated      Mobility Bed Mobility Overal bed mobility: Needs Assistance Bed Mobility: Sit to Supine;Supine to Sit     Supine to sit: Total assist;+2 for physical assistance;HOB elevated Sit to supine: +2 for physical assistance;Total assist;HOB elevated   General bed mobility comments: pt with no initiation or participation in supine<>sitting, pt reports limitations 2/2 pain  Transfers                 General transfer comment: unable to  assess this session    Balance Overall balance assessment: Needs assistance Sitting-balance support: Feet unsupported;Bilateral upper extremity supported Sitting balance-Leahy Scale: Zero Sitting balance - Comments: actively resisting sitting EOB this session requires total assist for static sitting balance EOB, pt with posterior lean                                   ADL either performed or assessed with clinical judgement   ADL Overall ADL's : Needs assistance/impaired Eating/Feeding: Set up;Minimal assistance;Bed level   Grooming: Maximal assistance;Bed level   Upper Body Bathing: Total assistance;Bed level   Lower Body Bathing: Total assistance;+2 for physical assistance;Bed level   Upper Body Dressing : Total assistance;Bed level   Lower Body Dressing: Total assistance;+2 for physical assistance                 General ADL Comments: Pt currently total A with most ADLs. Attempted bed mobility with pt needing +2 total assist and log sitting while perpendicular in bed. Actively resisting trunk flexion and RLE flexion. Sat in this position 2-3 minutes then returned pt to supine.      Vision         Perception     Praxis      Pertinent Vitals/Pain Pain Assessment: Faces Faces Pain Scale: Hurts whole lot Pain Location: LLE Pain Descriptors / Indicators: Grimacing;Guarding Pain Intervention(s): Limited activity within patient's tolerance;Monitored during session;Repositioned     Hand Dominance  Extremity/Trunk Assessment Upper Extremity Assessment Upper Extremity Assessment: Generalized weakness   Lower Extremity Assessment Lower Extremity Assessment: Defer to PT evaluation       Communication Communication Communication: HOH   Cognition Arousal/Alertness: Awake/alert Behavior During Therapy: WFL for tasks assessed/performed Overall Cognitive Status: History of cognitive impairments - at baseline                                  General Comments: pt with hx of dementia at baseline, pt lives in memory care unit for past ~6-7 years   General Comments  pt with pain limitations & poor motivation to participate because of pain, pt confused & will cry out intentially    Exercises     Shoulder Instructions      Home Living Family/patient expects to be discharged to:: Assisted living                                 Additional Comments: [facility] memory care unit (for past ~6-7 years)      Prior Functioning/Environment Level of Independence: Independent  Gait / Transfers Assistance Needed: no AD utilized at baseline per son's report     Comments: pt's son reports pt ambulated within unit slowly, without AD        OT Problem List: Decreased strength;Decreased activity tolerance;Impaired balance (sitting and/or standing);Decreased knowledge of use of DME or AE;Decreased cognition;Decreased safety awareness;Decreased knowledge of precautions;Pain      OT Treatment/Interventions: Self-care/ADL training;DME and/or AE instruction;Therapeutic activities;Patient/family education;Balance training    OT Goals(Current goals can be found in the care plan section) Acute Rehab OT Goals Patient Stated Goal: less pain OT Goal Formulation: With patient/family Time For Goal Achievement: 06/27/19 Potential to Achieve Goals: Good ADL Goals Pt Will Perform Upper Body Bathing: with max assist;sitting Pt Will Perform Lower Body Bathing: with max assist;sitting/lateral leans;sit to/from stand Pt Will Transfer to Toilet: with max assist;stand pivot transfer;bedside commode Additional ADL Goal #1: Pt will complete bed mobility at mod A level to prepare for EOB/OOB ADLs.  OT Frequency: Min 2X/week   Barriers to D/C:            Co-evaluation PT/OT/SLP Co-Evaluation/Treatment: Yes Reason for Co-Treatment: Necessary to address cognition/behavior during functional activity;For patient/therapist  safety;To address functional/ADL transfers PT goals addressed during session: Mobility/safety with mobility;Balance OT goals addressed during session: ADL's and self-care      AM-PAC OT "6 Clicks" Daily Activity     Outcome Measure Help from another person eating meals?: None Help from another person taking care of personal grooming?: A Lot Help from another person toileting, which includes using toliet, bedpan, or urinal?: Total Help from another person bathing (including washing, rinsing, drying)?: Total Help from another person to put on and taking off regular upper body clothing?: Total Help from another person to put on and taking off regular lower body clothing?: Total 6 Click Score: 10   End of Session Nurse Communication: Other (comment);Mobility status(nausea at start of session, 10/10 pain)  Activity Tolerance: Patient limited by pain;Treatment limited secondary to agitation Patient left: in bed;with call bell/phone within reach;with family/visitor present  OT Visit Diagnosis: Unsteadiness on feet (R26.81);Pain;Muscle weakness (generalized) (M62.81) Pain - Right/Left: Left Pain - part of body: Hip;Leg                Time: 5462-7035 OT Time  Calculation (min): 24 min Charges:  OT General Charges $OT Visit: 1 Visit OT Evaluation $OT Eval Moderate Complexity: 1 Mod  Raynald KempKathryn Ariellah Faust, OT Acute Rehabilitation Services Pager: [phone] Office: [phone]   Pilar GrammesMathews, Yuriel Lopezmartinez H 06/13/2019, 1:29 PM

## 2019-06-13 NOTE — Progress Notes (Signed)
Subjective: 1 Day Post-Op Procedure(s) (LRB): INTRAMEDULLARY (IM) NAIL INTERTROCHANTRIC (Left) Patient reports pain as mild.    Objective: Vital signs in last 24 hours: Temp:  [96.8 F (36 C)-98.7 F (37.1 C)] 98.2 F (36.8 C) (11/13 0348) Pulse Rate:  [77-125] 79 (11/13 0348) Resp:  [10-24] 14 (11/12 1815) BP: (84-144)/(53-84) 128/61 (11/13 0348) SpO2:  [94 %-100 %] 94 % (11/13 0348) Weight:  [63.5 kg] 63.5 kg (11/12 1439)  Intake/Output from previous day: 11/12 0701 - 11/13 0700 In: 500 [I.V.:500] Out: 725 [Urine:650; Blood:75] Intake/Output this shift: No intake/output data recorded.  Recent Labs    06/11/19 1014 06/13/19 0410  HGB 13.0 10.5*   Recent Labs    06/11/19 1014 06/13/19 0410  WBC 12.4* 10.6*  RBC 4.15 3.31*  HCT 39.5 31.8*  PLT 242 127*   Recent Labs    06/11/19 1014 06/13/19 0410  NA 138 135  K 3.1* 3.9  CL 107 107  CO2 22 18*  BUN 10 9  CREATININE 0.84 0.68  GLUCOSE 178* 116*  CALCIUM 8.7* 7.9*   Recent Labs    06/11/19 1014  INR 1.1    Neurologically intact Neurovascular intact Sensation intact distally Intact pulses distally Dorsiflexion/Plantar flexion intact Incision: dressing C/D/I No cellulitis present Compartment soft   Assessment/Plan: 1 Day Post-Op Procedure(s) (LRB): INTRAMEDULLARY (IM) NAIL INTERTROCHANTRIC (Left) Up with therapy  WBAT LLE lovenox 40 mg daily x 2 weeks for dvt ppx ABLA-mild and stable F/u with Dr. Erlinda Hong 2 weeks post-op for wound check and staple removal D/c dispo per primary team      Sheri Payne 06/13/2019, 7:59 AM

## 2019-06-13 NOTE — Plan of Care (Signed)

## 2019-06-13 NOTE — Progress Notes (Signed)
PROGRESS NOTE    Sheri Payne  WUJ:[id number] DOB: [birth year] DOA: 06/11/2019 PCP: System, Pcp Not In   Brief Narrative:  Per HPI: Sheri Botelhois a 85 y.o.femalewith medical history significant ofglaucoma, mild to moderate dementia who presents after an unwitnessed fall at facility today.Patient reports she had an unwitnessed fall this morning, while trying to get to her restroom. She states she was by herself. She denies any preceding symptoms, no chest pain, no palpitations, no loss of consciousness and states she recalls the event though is unaware if she had eaten this morning. She denies any numbness or sensory deficits. She believes she just tripped and fell.  She denies any other symptoms. She states she has not had any fevers, chills, nausea/vomiting, states she is eating well and having normal bowel movements. She presently reports her L hip pain is well controlled.  11/12: Ortho plans for ORIF to L hip this afternoon. Pain controlled.  11/13: Pain appears to be well controlled and patient has undergone left hip ORIF per orthopedics on 11/12.  No complications noted.  Awaiting therapies and placement.  Assessment & Plan:   Principal Problem:   Displaced intertrochanteric fracture of left femur, initial encounter for closed fracture [facility]) Active Problems:   Hip fracture (HCC)   Traumatic fracture of L. Femur status post intramedullary implant 11/12 -Diet has been advanced -Therapies are pending -Continue on Lovenox for 2 weeks as noted -Discharge when rehab bed arranged  Hypokalemia-resolved -Recheck a.m. labs  Glaucoma - continue eye gtts  Dementia - delirium precautions   DVT prophylaxis:Lovenox Code Status: Full Family Communication: None at bedside Disposition Plan:  Therapy is currently pending.  Patient is status post intramedullary nail on 11/12.   Consultants:   Orthopedics  Procedures:   Intramedullary implant of left  femur fracture 11/12.  Antimicrobials:  Anti-infectives (From admission, onward)   Start     Dose/Rate Route Frequency Ordered Stop   06/12/19 1930  ceFAZolin (ANCEF) IVPB 2g/100 mL premix     2 g 200 mL/hr over 30 Minutes Intravenous Every 8 hours 06/12/19 1814 06/13/19 1115   06/12/19 1421  ceFAZolin (ANCEF) 2-4 GM/100ML-% IVPB  Status:  Discontinued    Note to Pharmacy: Marga Melnick   : cabinet override      06/12/19 1421 06/12/19 1501   06/12/19 1400  ceFAZolin (ANCEF) IVPB 2g/100 mL premix     2 g 200 mL/hr over 30 Minutes Intravenous On call to O.R. 06/11/19 2049 06/12/19 1535      Subjective: Patient seen and evaluated today with no new acute complaints or concerns. No acute concerns or events noted overnight.  Objective: Vitals:   06/12/19 2343 06/13/19 0132 06/13/19 0348 06/13/19 0817  BP: (!) 84/69 (!) 110/53 128/61 136/66  Pulse: 79 81 79 86  Resp:    16  Temp: 98.7 F (37.1 C)  98.2 F (36.8 C) 98.5 F (36.9 C)  TempSrc: Oral  Oral Oral  SpO2: 95%  94% 94%  Weight:      Height:        Intake/Output Summary (Last 24 hours) at 06/13/2019 1155 Last data filed at 06/13/2019 0700 Gross per 24 hour  Intake 500 ml  Output 725 ml  Net -225 ml   Filed Weights   06/12/19 1439  Weight: 63.5 kg    Examination:  General exam: Appears calm and comfortable  Respiratory system: Clear to auscultation. Respiratory effort normal. Cardiovascular system: S1 & S2 heard, RRR. No JVD, murmurs, rubs,  gallops or clicks. No pedal edema. Gastrointestinal system: Abdomen is nondistended, soft and nontender. No organomegaly or masses felt. Normal bowel sounds heard. Central nervous system: Alert and oriented. No focal neurological deficits. Extremities: Symmetric 5 x 5 power. Skin: No rashes, lesions or ulcers Psychiatry: Judgement and insight appear normal. Mood & affect appropriate.     Data Reviewed: I have personally reviewed following labs and imaging  studies  CBC: Recent Labs  Lab 06/11/19 1014 06/13/19 0410  WBC 12.4* 10.6*  NEUTROABS 8.1*  --   HGB 13.0 10.5*  HCT 39.5 31.8*  MCV 95.2 96.1  PLT 242 127*   Basic Metabolic Panel: Recent Labs  Lab 06/11/19 1014 06/13/19 0410  NA 138 135  K 3.1* 3.9  CL 107 107  CO2 22 18*  GLUCOSE 178* 116*  BUN 10 9  CREATININE 0.84 0.68  CALCIUM 8.7* 7.9*  MG  --  1.8   GFR: Estimated Creatinine Clearance: 39.6 mL/min (by C-G formula based on SCr of 0.68 mg/dL). Liver Function Tests: No results for input(s): AST, ALT, ALKPHOS, BILITOT, PROT, ALBUMIN in the last 168 hours. No results for input(s): LIPASE, AMYLASE in the last 168 hours. No results for input(s): AMMONIA in the last 168 hours. Coagulation Profile: Recent Labs  Lab 06/11/19 1014  INR 1.1   Cardiac Enzymes: No results for input(s): CKTOTAL, CKMB, CKMBINDEX, TROPONINI in the last 168 hours. BNP (last 3 results) No results for input(s): PROBNP in the last 8760 hours. HbA1C: No results for input(s): HGBA1C in the last 72 hours. CBG: No results for input(s): GLUCAP in the last 168 hours. Lipid Profile: No results for input(s): CHOL, HDL, LDLCALC, TRIG, CHOLHDL, LDLDIRECT in the last 72 hours. Thyroid Function Tests: No results for input(s): TSH, T4TOTAL, FREET4, T3FREE, THYROIDAB in the last 72 hours. Anemia Panel: No results for input(s): VITAMINB12, FOLATE, FERRITIN, TIBC, IRON, RETICCTPCT in the last 72 hours. Sepsis Labs: No results for input(s): PROCALCITON, LATICACIDVEN in the last 168 hours.  Recent Results (from the past 240 hour(s))  SARS CORONAVIRUS 2 (TAT 6-24 HRS) Nasopharyngeal Nasopharyngeal Swab     Status: None   Collection Time: 06/11/19  1:20 PM   Specimen: Nasopharyngeal Swab  Result Value Ref Range Status   SARS Coronavirus 2 NEGATIVE NEGATIVE Final    Comment: (NOTE) SARS-CoV-2 target nucleic acids are NOT DETECTED. The SARS-CoV-2 RNA is generally detectable in upper and  lower respiratory specimens during the acute phase of infection. Negative results do not preclude SARS-CoV-2 infection, do not rule out co-infections with other pathogens, and should not be used as the sole basis for treatment or other patient management decisions. Negative results must be combined with clinical observations, patient history, and epidemiological information. The expected result is Negative. Fact Sheet for Patients: HairSlick.nohttps://www.fda.gov/media/138098/download Fact Sheet for Healthcare Providers: quierodirigir.comhttps://www.fda.gov/media/138095/download This test is not yet approved or cleared by the [place] FDA and  has been authorized for detection and/or diagnosis of SARS-CoV-2 by FDA under an Emergency Use Authorization (EUA). This EUA will remain  in effect (meaning this test can be used) for the duration of the COVID-19 declaration under Section 56 4(b)(1) of the Act, 21 U.S.C. section 360bbb-3(b)(1), unless the authorization is terminated or revoked sooner. Performed at [facility], [address] [place], [place] [address]   MRSA PCR Screening     Status: None   Collection Time: 06/12/19  2:42 AM   Specimen: Nasal Mucosa; Nasopharyngeal  Result Value Ref Range Status  MRSA by PCR NEGATIVE NEGATIVE Final    Comment:        The GeneXpert MRSA Assay (FDA approved for NASAL specimens only), is one component of a comprehensive MRSA colonization surveillance program. It is not intended to diagnose MRSA infection nor to guide or monitor treatment for MRSA infections. Performed at [facility], [address] [place], [place] [address]          Radiology Studies: Dg C-arm 1-60 Min  Result Date: 06/12/2019 CLINICAL DATA:  Left intertrochanteric femur fracture ORIF. EXAM: LEFT FEMUR 2 VIEWS; DG C-ARM 1-60 MIN COMPARISON:  Left hip x-rays from yesterday. FLUOROSCOPY TIME:  1 minutes, 53 seconds. C-arm fluoroscopic images were obtained intraoperatively  and submitted for post operative interpretation. FINDINGS: Multiple intraoperative fluoroscopic images demonstrate interval cephalomedullary nail fixation of the left intertrochanteric hip fracture, now in anatomic alignment. IMPRESSION: Intraoperative fluoroscopic guidance for left intertrochanteric femur fracture ORIF. Electronically Signed   By: Obie Dredge M.D.   On: 06/12/2019 16:12   Dg Femur Min 2 Views Left  Result Date: 06/12/2019 CLINICAL DATA:  Left intertrochanteric femur fracture ORIF. EXAM: LEFT FEMUR 2 VIEWS; DG C-ARM 1-60 MIN COMPARISON:  Left hip x-rays from yesterday. FLUOROSCOPY TIME:  1 minutes, 53 seconds. C-arm fluoroscopic images were obtained intraoperatively and submitted for post operative interpretation. FINDINGS: Multiple intraoperative fluoroscopic images demonstrate interval cephalomedullary nail fixation of the left intertrochanteric hip fracture, now in anatomic alignment. IMPRESSION: Intraoperative fluoroscopic guidance for left intertrochanteric femur fracture ORIF. Electronically Signed   By: Obie Dredge M.D.   On: 06/12/2019 16:12        Scheduled Meds:  acetaminophen  500 mg Oral Q6H   aspirin EC  81 mg Oral Daily   brinzolamide  1 drop Right Eye BID   And   brimonidine  1 drop Right Eye BID   docusate sodium  100 mg Oral BID   enoxaparin (LOVENOX) injection  40 mg Subcutaneous Q24H   latanoprost  1 drop Both Eyes QHS   montelukast  10 mg Oral QHS   multivitamin with minerals   Oral Daily   Continuous Infusions:  sodium chloride 1,000 mL (06/11/19 1428)   sodium chloride     lactated ringers 10 mL/hr at 06/12/19 2120   methocarbamol (ROBAXIN) IV       LOS: 2 days    Time spent: 30 minutes    Yaneli Keithley Hoover Brunette, DO [facility] Pager [phone]  If 7PM-7AM, please contact night-coverage [email or web address] Password [email or web address] 06/13/2019, 11:55 AM

## 2019-06-13 NOTE — Plan of Care (Signed)
Pt is refusing medications and medical interventions this morning. She states "I am tired, no I don't want anything, don't touch me." Pt oriented x1 to person, reoriented her to situation, place, and surroundings.   Problem: Education: Goal: Knowledge of General Education information will improve Description: Including pain rating scale, medication(s)/side effects and non-pharmacologic comfort measures Outcome: Progressing   Problem: Clinical Measurements: Goal: Will remain free from infection Outcome: Progressing   Problem: Activity: Goal: Risk for activity intolerance will decrease Outcome: Progressing   Problem: Nutrition: Goal: Adequate nutrition will be maintained Outcome: Progressing   Problem: Coping: Goal: Level of anxiety will decrease Outcome: Progressing   Problem: Pain Managment: Goal: General experience of comfort will improve Outcome: Progressing   Problem: Safety: Goal: Ability to remain free from injury will improve Outcome: Progressing   Problem: Skin Integrity: Goal: Risk for impaired skin integrity will decrease Outcome: Progressing

## 2019-06-13 NOTE — Evaluation (Addendum)
Physical Therapy Evaluation Patient Details Name: Sheri Payne MRN: [id number] DOB: 1926-01-06 Today's Date: 06/13/2019   History of Present Illness  Pt is a 85 y/o F admitted on 06/11/2019 after an unwittness fall at a facility & found to have an acute L hip fx. Pt underwent LLE IM nail placement on 06/12/2019.  Pt with PMH significant for glaucoma & dementia.  Clinical Impression   Pt with decreased motivation to participate in therapy as pt experiences pain with movement. Pt requires heavy total assist +2 for bed mobility & static sitting balance EOB. Pt does not attempt to relax BLE while sitting EOB, instead holding them out straight. Pt's son present for session & trying to encourage pt. Pt would benefit from acute PT services to address problems noted below, to increase independence prior to d/c. Pt will need 24 hr assist upon d/c.      Follow Up Recommendations Supervision/Assistance - 24 hour;SNF    Equipment Recommendations  (TBD in next venue)    Recommendations for Other Services       Precautions / Restrictions Precautions Precautions: Fall Precaution Comments: Pt has been resident of memory care unit for several years per her son. Restrictions Weight Bearing Restrictions: Yes LLE Weight Bearing: Weight bearing as tolerated      Mobility  Bed Mobility Overal bed mobility: Needs Assistance Bed Mobility: Sit to Supine;Supine to Sit     Supine to sit: Total assist;+2 for physical assistance;HOB elevated Sit to supine: +2 for physical assistance;Total assist;HOB elevated   General bed mobility comments: pt with no initiation or participation in supine<>sitting, pt reports limitations 2/2 pain  Transfers                 General transfer comment: unable to assess this session  Ambulation/Gait                Stairs            Wheelchair Mobility    Modified Rankin (Stroke Patients Only)       Balance Overall balance assessment:  Needs assistance Sitting-balance support: Feet unsupported;Bilateral upper extremity supported Sitting balance-Leahy Scale: Zero Sitting balance - Comments: actively resisting sitting EOB this session requires total assist for static sitting balance EOB, pt with posterior lean                                     Pertinent Vitals/Pain Pain Assessment: Faces Faces Pain Scale: Hurts whole lot Pain Location: LLE Pain Descriptors / Indicators: Grimacing;Guarding Pain Intervention(s): Limited activity within patient's tolerance;Monitored during session;Repositioned    Home Living Family/patient expects to be discharged to:: Assisted living                 Additional Comments: [facility] memory care unit (for past ~6-7 years)    Prior Function Level of Independence: Independent   Gait / Transfers Assistance Needed: no AD utilized at baseline per son's report     Comments: pt's son reports pt ambulated within unit slowly, without AD     Hand Dominance        Extremity/Trunk Assessment   Upper Extremity Assessment Upper Extremity Assessment: Generalized weakness    Lower Extremity Assessment Lower Extremity Assessment: Defer to PT evaluation       Communication   Communication: HOH  Cognition Arousal/Alertness: Awake/alert Behavior During Therapy: WFL for tasks assessed/performed Overall Cognitive Status: History of cognitive impairments -  at baseline                                 General Comments: pt with hx of dementia at baseline, pt lives in memory care unit for past ~6-7 years      General Comments General comments (skin integrity, edema, etc.): pt with pain limitations & poor motivation to participate because of pain, pt confused & will cry out intentially    Exercises     Assessment/Plan    PT Assessment Patient needs continued PT services  PT Problem List Decreased strength;Decreased balance;Decreased  cognition;Decreased knowledge of precautions;Pain;Cardiopulmonary status limiting activity;Decreased range of motion;Decreased mobility;Decreased knowledge of use of DME;Decreased activity tolerance;Decreased coordination;Decreased safety awareness;Decreased skin integrity;Impaired sensation       PT Treatment Interventions DME instruction;Functional mobility training;Balance training;Patient/family education;Wheelchair mobility training;Neuromuscular re-education;Therapeutic activities;Gait training;Stair training;Cognitive remediation;Manual techniques;Therapeutic exercise    PT Goals (Current goals can be found in the Care Plan section)  Acute Rehab PT Goals Patient Stated Goal: less pain PT Goal Formulation: With patient Time For Goal Achievement: 06/27/19 Potential to Achieve Goals: Fair    Frequency Min 3X/week   Barriers to discharge        Co-evaluation PT/OT/SLP Co-Evaluation/Treatment: Yes Reason for Co-Treatment: Necessary to address cognition/behavior during functional activity;For patient/therapist safety;To address functional/ADL transfers PT goals addressed during session: Mobility/safety with mobility;Balance OT goals addressed during session: ADL's and self-care       AM-PAC PT "6 Clicks" Mobility  Outcome Measure Help needed turning from your back to your side while in a flat bed without using bedrails?: Total Help needed moving from lying on your back to sitting on the side of a flat bed without using bedrails?: Total Help needed moving to and from a bed to a chair (including a wheelchair)?: Total Help needed standing up from a chair using your arms (e.g., wheelchair or bedside chair)?: Total Help needed to walk in hospital room?: Total Help needed climbing 3-5 steps with a railing? : Total 6 Click Score: 6    End of Session Equipment Utilized During Treatment: (attempted to don nasal cannula when pt's SpO2 dropped to 89% on room air but pt actively blowing it  out, SpO2 returned to >92% within seconds) Activity Tolerance: Patient limited by pain Patient left: in bed;with call bell/phone within reach;with SCD's reapplied;with family/visitor present;with bed alarm set Nurse Communication: Mobility status PT Visit Diagnosis: Unsteadiness on feet (R26.81);Muscle weakness (generalized) (M62.81);Difficulty in walking, not elsewhere classified (R26.2)    Time: 2440-1027 PT Time Calculation (min) (ACUTE ONLY): 24 min   Charges:   PT Evaluation $PT Eval High Complexity: 1 High             Waunita Schooner, PT, DPT 06/13/2019, 1:35 PM

## 2019-06-14 LAB — CBC
HCT: 28.6 % — ABNORMAL LOW (ref 36.0–46.0)
Hemoglobin: 9.6 g/dL — ABNORMAL LOW (ref 12.0–15.0)
MCH: 31.5 pg (ref 26.0–34.0)
MCHC: 33.6 g/dL (ref 30.0–36.0)
MCV: 93.8 fL (ref 80.0–100.0)
Platelets: 134 10*3/uL — ABNORMAL LOW (ref 150–400)
RBC: 3.05 MIL/uL — ABNORMAL LOW (ref 3.87–5.11)
RDW: 13.1 % (ref 11.5–15.5)
WBC: 9 10*3/uL (ref 4.0–10.5)
nRBC: 0 % (ref 0.0–0.2)

## 2019-06-14 LAB — BASIC METABOLIC PANEL
Anion gap: 8 (ref 5–15)
BUN: 8 mg/dL (ref 8–23)
CO2: 23 mmol/L (ref 22–32)
Calcium: 8 mg/dL — ABNORMAL LOW (ref 8.9–10.3)
Chloride: 105 mmol/L (ref 98–111)
Creatinine, Ser: 0.73 mg/dL (ref 0.44–1.00)
GFR calc Af Amer: >60 mL/min (ref >60)
GFR calc non Af Amer: >60 mL/min (ref >60)
Glucose, Bld: 111 mg/dL — ABNORMAL HIGH (ref 70–99)
Potassium: 3.4 mmol/L — ABNORMAL LOW (ref 3.5–5.1)
Sodium: 136 mmol/L (ref 135–145)

## 2019-06-14 MED ORDER — DOCUSATE SODIUM 50 MG/5ML PO LIQD
100.0000 mg | Freq: Two times a day (BID) | ORAL | Status: DC
  Administered 2019-06-14 – 2019-06-17 (×5): 100 mg via ORAL
  Filled 2019-06-14 (×6): qty 10

## 2019-06-14 MED ORDER — POTASSIUM CHLORIDE 20 MEQ PO PACK
40.0000 meq | PACK | Freq: Once | ORAL | Status: AC
  Administered 2019-06-14: 40 meq via ORAL
  Filled 2019-06-14: qty 2

## 2019-06-14 NOTE — Plan of Care (Signed)
  Problem: Coping: Goal: Level of anxiety will decrease Outcome: Progressing   Problem: Pain Managment: Goal: General experience of comfort will improve Outcome: Progressing   Problem: Safety: Goal: Ability to remain free from injury will improve Outcome: Progressing   

## 2019-06-14 NOTE — Progress Notes (Signed)
PROGRESS NOTE    Sheri Payne  OIN:[id number] DOB: Oct 05, 1925 DOA: 06/11/2019 PCP: System, Pcp Not In   Brief Narrative:  Per HPI: Sheri Botelhois a 85 y.o.femalewith medical history significant ofglaucoma, mild to moderate dementia who presents after an unwitnessed fall at facility today.Patient reports she had an unwitnessed fall this morning, while trying to get to her restroom. She states she was by herself. She denies any preceding symptoms, no chest pain, no palpitations, no loss of consciousness and states she recalls the event though is unaware if she had eaten this morning. She denies any numbness or sensory deficits. She believes she just tripped and fell.  She denies any other symptoms. She states she has not had any fevers, chills, nausea/vomiting, states she is eating well and having normal bowel movements. She presently reports her L hip pain is well controlled.  11/12: Ortho plans for ORIF to L hip this afternoon. Pain controlled.  11/13: Pain appears to be well controlled and patient has undergone left hip ORIF per orthopedics on 11/12.  No complications noted.  Awaiting therapies and placement.  06/14/2019: Patient is stable.  Awaiting disposition.  Pursue disposition.  Assessment & Plan:   Principal Problem:   Displaced intertrochanteric fracture of left femur, initial encounter for closed fracture [facility]) Active Problems:   Hip fracture (HCC)   Traumatic fracture of L. Femur status post intramedullary implant 11/12 -Diet has been advanced -Therapies are pending -Continue on Lovenox for 2 weeks as noted -Discharge when rehab bed arranged  Hypokalemia:  -Potassium is 3.4 today (06/14/2019) -Continue to monitor and replete.   Glaucoma - continue eye gtts  Dementia - delirium precautions   DVT prophylaxis:Lovenox Code Status: Full Family Communication: None at bedside Disposition Plan:  Therapy is currently pending.  Patient is  status post intramedullary nail on 11/12.   Consultants:   Orthopedics  Procedures:   Intramedullary implant of left femur fracture 11/12.  Antimicrobials:  Anti-infectives (From admission, onward)   Start     Dose/Rate Route Frequency Ordered Stop   06/12/19 1930  ceFAZolin (ANCEF) IVPB 2g/100 mL premix     2 g 200 mL/hr over 30 Minutes Intravenous Every 8 hours 06/12/19 1814 06/13/19 1115   06/12/19 1421  ceFAZolin (ANCEF) 2-4 GM/100ML-% IVPB  Status:  Discontinued    Note to Pharmacy: [name]   : cabinet override      06/12/19 1421 06/12/19 1501   06/12/19 1400  ceFAZolin (ANCEF) IVPB 2g/100 mL premix     2 g 200 mL/hr over 30 Minutes Intravenous On call to O.R. 06/11/19 2049 06/12/19 1535      Subjective: Patient seen No new complaints Pursue disposition when a bed is available.   Objective: Vitals:   06/13/19 0817 06/13/19 2000 06/14/19 0504 06/14/19 0833  BP: 136/66 123/62 (!) 141/75 (!) 141/64  Pulse: 86 99 83 89  Resp: 16   16  Temp: 98.5 F (36.9 C) 98.6 F (37 C) 98.7 F (37.1 C) 98.1 F (36.7 C)  TempSrc: Oral Oral Oral Oral  SpO2: 94% 90% 94% 93%  Weight:      Height:        Intake/Output Summary (Last 24 hours) at 06/14/2019 0928 Last data filed at 06/14/2019 0700 Gross per 24 hour  Intake 720 ml  Output 1100 ml  Net -380 ml   Filed Weights   06/12/19 1439  Weight: 63.5 kg    Examination:  General exam: Appears calm and comfortable  Respiratory system:  Clear to auscultation.  Cardiovascular system: S1 & S2 heard Gastrointestinal system: Abdomen is nondistended, soft and nontender. No organomegaly or masses felt. Normal bowel sounds heard. Central nervous system: Alert and oriented.  Patient moves all extremities   Data Reviewed: I have personally reviewed following labs and imaging studies  CBC: Recent Labs  Lab 06/11/19 1014 06/13/19 0410 06/14/19 0336  WBC 12.4* 10.6* 9.0  NEUTROABS 8.1*  --   --   HGB 13.0 10.5*  9.6*  HCT 39.5 31.8* 28.6*  MCV 95.2 96.1 93.8  PLT 242 127* 134*   Basic Metabolic Panel: Recent Labs  Lab 06/11/19 1014 06/13/19 0410 06/14/19 0336  NA 138 135 136  K 3.1* 3.9 3.4*  CL 107 107 105  CO2 22 18* 23  GLUCOSE 178* 116* 111*  BUN 10 9 8   CREATININE 0.84 0.68 0.73  CALCIUM 8.7* 7.9* 8.0*  MG  --  1.8  --    GFR: Estimated Creatinine Clearance: 39.6 mL/min (by C-G formula based on SCr of 0.73 mg/dL). Liver Function Tests: No results for input(s): AST, ALT, ALKPHOS, BILITOT, PROT, ALBUMIN in the last 168 hours. No results for input(s): LIPASE, AMYLASE in the last 168 hours. No results for input(s): AMMONIA in the last 168 hours. Coagulation Profile: Recent Labs  Lab 06/11/19 1014  INR 1.1   Cardiac Enzymes: No results for input(s): CKTOTAL, CKMB, CKMBINDEX, TROPONINI in the last 168 hours. BNP (last 3 results) No results for input(s): PROBNP in the last 8760 hours. HbA1C: No results for input(s): HGBA1C in the last 72 hours. CBG: No results for input(s): GLUCAP in the last 168 hours. Lipid Profile: No results for input(s): CHOL, HDL, LDLCALC, TRIG, CHOLHDL, LDLDIRECT in the last 72 hours. Thyroid Function Tests: No results for input(s): TSH, T4TOTAL, FREET4, T3FREE, THYROIDAB in the last 72 hours. Anemia Panel: No results for input(s): VITAMINB12, FOLATE, FERRITIN, TIBC, IRON, RETICCTPCT in the last 72 hours. Sepsis Labs: No results for input(s): PROCALCITON, LATICACIDVEN in the last 168 hours.  Recent Results (from the past 240 hour(s))  SARS CORONAVIRUS 2 (TAT 6-24 HRS) Nasopharyngeal Nasopharyngeal Swab     Status: None   Collection Time: 06/11/19  1:20 PM   Specimen: Nasopharyngeal Swab  Result Value Ref Range Status   SARS Coronavirus 2 NEGATIVE NEGATIVE Final    Comment: (NOTE) SARS-CoV-2 target nucleic acids are NOT DETECTED. The SARS-CoV-2 RNA is generally detectable in upper and lower respiratory specimens during the acute phase of  infection. Negative results do not preclude SARS-CoV-2 infection, do not rule out co-infections with other pathogens, and should not be used as the sole basis for treatment or other patient management decisions. Negative results must be combined with clinical observations, patient history, and epidemiological information. The expected result is Negative. Fact Sheet for Patients: HairSlick.nohttps://www.fda.gov/media/138098/download Fact Sheet for Healthcare Providers: quierodirigir.comhttps://www.fda.gov/media/138095/download This test is not yet approved or cleared by the [place] FDA and  has been authorized for detection and/or diagnosis of SARS-CoV-2 by FDA under an Emergency Use Authorization (EUA). This EUA will remain  in effect (meaning this test can be used) for the duration of the COVID-19 declaration under Section 56 4(b)(1) of the Act, 21 U.S.C. section 360bbb-3(b)(1), unless the authorization is terminated or revoked sooner. Performed at [facility], [address] [place], [place] [address]   MRSA PCR Screening     Status: None   Collection Time: 06/12/19  2:42 AM   Specimen: Nasal Mucosa; Nasopharyngeal  Result Value  Ref Range Status   MRSA by PCR NEGATIVE NEGATIVE Final    Comment:        The GeneXpert MRSA Assay (FDA approved for NASAL specimens only), is one component of a comprehensive MRSA colonization surveillance program. It is not intended to diagnose MRSA infection nor to guide or monitor treatment for MRSA infections. Performed at [facility], [place] [address], [place], [place] [address]          Radiology Studies: Dg C-arm 1-60 Min  Result Date: 06/12/2019 CLINICAL DATA:  Left intertrochanteric femur fracture ORIF. EXAM: LEFT FEMUR 2 VIEWS; DG C-ARM 1-60 MIN COMPARISON:  Left hip x-rays from yesterday. FLUOROSCOPY TIME:  1 minutes, 53 seconds. C-arm fluoroscopic images were obtained intraoperatively and submitted for post operative interpretation.  FINDINGS: Multiple intraoperative fluoroscopic images demonstrate interval cephalomedullary nail fixation of the left intertrochanteric hip fracture, now in anatomic alignment. IMPRESSION: Intraoperative fluoroscopic guidance for left intertrochanteric femur fracture ORIF. Electronically Signed   By: Titus Dubin M.D.   On: 06/12/2019 16:12   Dg Femur Min 2 Views Left  Result Date: 06/12/2019 CLINICAL DATA:  Left intertrochanteric femur fracture ORIF. EXAM: LEFT FEMUR 2 VIEWS; DG C-ARM 1-60 MIN COMPARISON:  Left hip x-rays from yesterday. FLUOROSCOPY TIME:  1 minutes, 53 seconds. C-arm fluoroscopic images were obtained intraoperatively and submitted for post operative interpretation. FINDINGS: Multiple intraoperative fluoroscopic images demonstrate interval cephalomedullary nail fixation of the left intertrochanteric hip fracture, now in anatomic alignment. IMPRESSION: Intraoperative fluoroscopic guidance for left intertrochanteric femur fracture ORIF. Electronically Signed   By: Titus Dubin M.D.   On: 06/12/2019 16:12        Scheduled Meds:  aspirin EC  81 mg Oral Daily   brinzolamide  1 drop Right Eye BID   And   brimonidine  1 drop Right Eye BID   docusate sodium  100 mg Oral BID   enoxaparin (LOVENOX) injection  40 mg Subcutaneous Q24H   latanoprost  1 drop Both Eyes QHS   montelukast  10 mg Oral QHS   multivitamin with minerals   Oral Daily   Continuous Infusions:  sodium chloride 1,000 mL (06/11/19 1428)   sodium chloride 75 mL/hr at 06/13/19 1611   lactated ringers 10 mL/hr at 06/12/19 2120   methocarbamol (ROBAXIN) IV       LOS: 3 days    Time spent: 25 minutes   Bonnell Public, MD [facility]  [email or web address] Password [facility] 06/14/2019, 9:28 AM

## 2019-06-15 LAB — CBC
HCT: 26.5 % — ABNORMAL LOW (ref 36.0–46.0)
Hemoglobin: 8.9 g/dL — ABNORMAL LOW (ref 12.0–15.0)
MCH: 31.7 pg (ref 26.0–34.0)
MCHC: 33.6 g/dL (ref 30.0–36.0)
MCV: 94.3 fL (ref 80.0–100.0)
Platelets: 165 10*3/uL (ref 150–400)
RBC: 2.81 MIL/uL — ABNORMAL LOW (ref 3.87–5.11)
RDW: 13.1 % (ref 11.5–15.5)
WBC: 8.2 10*3/uL (ref 4.0–10.5)
nRBC: 0 % (ref 0.0–0.2)

## 2019-06-15 NOTE — Progress Notes (Signed)
The patient has to be prompted to eat or drink.  She has been encouraged to at least drink more fluids.  Appetite is poor.  She reports that "nothing tastes good to her".  The patient has voiced several times today, "I wish I would just die".

## 2019-06-15 NOTE — Progress Notes (Signed)
PROGRESS NOTE    Glory RosebushBarbara Pitcock  ZOX:[id number] DOB: 03/30/1926 DOA: 06/11/2019 PCP: System, Pcp Not In   Brief Narrative:  Per HPI: Sheri MccreedyBarbara Botelhois a 85 y.o.femalewith medical history significant ofglaucoma, mild to moderate dementia who presents after an unwitnessed fall at facility today.Patient reports she had an unwitnessed fall this morning, while trying to get to her restroom. She states she was by herself. She denies any preceding symptoms, no chest pain, no palpitations, no loss of consciousness and states she recalls the event though is unaware if she had eaten this morning. She denies any numbness or sensory deficits. She believes she just tripped and fell.  She denies any other symptoms. She states she has not had any fevers, chills, nausea/vomiting, states she is eating well and having normal bowel movements. She presently reports her L hip pain is well controlled.  11/12: Ortho plans for ORIF to L hip this afternoon. Pain controlled.  11/13: Pain appears to be well controlled and patient has undergone left hip ORIF per orthopedics on 11/12.  No complications noted.  Awaiting therapies and placement.  06/15/2019: Patient is stable.  Awaiting disposition.  Pursue disposition.  Assessment & Plan:   Principal Problem:   Displaced intertrochanteric fracture of left femur, initial encounter for closed fracture [facility](HCC) Active Problems:   Hip fracture (HCC)   Traumatic fracture of L. Femur status post intramedullary implant 11/12 -Diet has been advanced -Therapies are pending -Continue on Lovenox for 2 weeks as noted -Discharge when rehab bed arranged  Hypokalemia:  -Potassium is 3.4 today (06/14/2019) -Continue to monitor and replete.   Glaucoma - continue eye gtts  Dementia - delirium precautions   DVT prophylaxis:Lovenox Code Status: Full Family Communication: None at bedside Disposition Plan:  Therapy is currently pending.  Patient is  status post intramedullary nail on 11/12.   Consultants:   Orthopedics  Procedures:   Intramedullary implant of left femur fracture 11/12.  Antimicrobials:  Anti-infectives (From admission, onward)   Start     Dose/Rate Route Frequency Ordered Stop   06/12/19 1930  ceFAZolin (ANCEF) IVPB 2g/100 mL premix     2 g 200 mL/hr over 30 Minutes Intravenous Every 8 hours 06/12/19 1814 06/13/19 1115   06/12/19 1421  ceFAZolin (ANCEF) 2-4 GM/100ML-% IVPB  Status:  Discontinued    Note to Pharmacy: Janene HarveyMurray, Karol   : cabinet override      06/12/19 1421 06/12/19 1501   06/12/19 1400  ceFAZolin (ANCEF) IVPB 2g/100 mL premix     2 g 200 mL/hr over 30 Minutes Intravenous On call to O.R. 06/11/19 2049 06/12/19 1535      Subjective: Patient seen No new complaints Pursue disposition when a bed is available.   Objective: Vitals:   06/14/19 1520 06/14/19 2105 06/15/19 0335 06/15/19 0807  BP: 137/70 (!) 149/72 (!) 155/69 (!) 147/76  Pulse: 84 85 78 74  Resp: 17   16  Temp: 98.4 F (36.9 C) 97.7 F (36.5 C) 98 F (36.7 C) 98.4 F (36.9 C)  TempSrc: Oral Oral Oral   SpO2: 97% 94% 94% 97%  Weight:      Height:        Intake/Output Summary (Last 24 hours) at 06/15/2019 1307 Last data filed at 06/15/2019 0900 Gross per 24 hour  Intake 40 ml  Output 1050 ml  Net -1010 ml   Filed Weights   06/12/19 1439  Weight: 63.5 kg    Examination:  General exam: Appears calm and comfortable  Respiratory  system: Clear to auscultation.  Cardiovascular system: S1 & S2 heard Gastrointestinal system: Abdomen is nondistended, soft and nontender. No organomegaly or masses felt. Normal bowel sounds heard. Central nervous system: Alert and oriented.  Patient moves all extremities   Data Reviewed: I have personally reviewed following labs and imaging studies  CBC: Recent Labs  Lab 06/11/19 1014 06/13/19 0410 06/14/19 0336 06/15/19 0314  WBC 12.4* 10.6* 9.0 8.2  NEUTROABS 8.1*  --   --    --   HGB 13.0 10.5* 9.6* 8.9*  HCT 39.5 31.8* 28.6* 26.5*  MCV 95.2 96.1 93.8 94.3  PLT 242 127* 134* 165   Basic Metabolic Panel: Recent Labs  Lab 06/11/19 1014 06/13/19 0410 06/14/19 0336  NA 138 135 136  K 3.1* 3.9 3.4*  CL 107 107 105  CO2 22 18* 23  GLUCOSE 178* 116* 111*  BUN 10 9 8   CREATININE 0.84 0.68 0.73  CALCIUM 8.7* 7.9* 8.0*  MG  --  1.8  --    GFR: Estimated Creatinine Clearance: 39.6 mL/min (by C-G formula based on SCr of 0.73 mg/dL). Liver Function Tests: No results for input(s): AST, ALT, ALKPHOS, BILITOT, PROT, ALBUMIN in the last 168 hours. No results for input(s): LIPASE, AMYLASE in the last 168 hours. No results for input(s): AMMONIA in the last 168 hours. Coagulation Profile: Recent Labs  Lab 06/11/19 1014  INR 1.1   Cardiac Enzymes: No results for input(s): CKTOTAL, CKMB, CKMBINDEX, TROPONINI in the last 168 hours. BNP (last 3 results) No results for input(s): PROBNP in the last 8760 hours. HbA1C: No results for input(s): HGBA1C in the last 72 hours. CBG: No results for input(s): GLUCAP in the last 168 hours. Lipid Profile: No results for input(s): CHOL, HDL, LDLCALC, TRIG, CHOLHDL, LDLDIRECT in the last 72 hours. Thyroid Function Tests: No results for input(s): TSH, T4TOTAL, FREET4, T3FREE, THYROIDAB in the last 72 hours. Anemia Panel: No results for input(s): VITAMINB12, FOLATE, FERRITIN, TIBC, IRON, RETICCTPCT in the last 72 hours. Sepsis Labs: No results for input(s): PROCALCITON, LATICACIDVEN in the last 168 hours.  Recent Results (from the past 240 hour(s))  SARS CORONAVIRUS 2 (TAT 6-24 HRS) Nasopharyngeal Nasopharyngeal Swab     Status: None   Collection Time: 06/11/19  1:20 PM   Specimen: Nasopharyngeal Swab  Result Value Ref Range Status   SARS Coronavirus 2 NEGATIVE NEGATIVE Final    Comment: (NOTE) SARS-CoV-2 target nucleic acids are NOT DETECTED. The SARS-CoV-2 RNA is generally detectable in upper and lower respiratory  specimens during the acute phase of infection. Negative results do not preclude SARS-CoV-2 infection, do not rule out co-infections with other pathogens, and should not be used as the sole basis for treatment or other patient management decisions. Negative results must be combined with clinical observations, patient history, and epidemiological information. The expected result is Negative. Fact Sheet for Patients: 13/11/20 Fact Sheet for Healthcare Providers: HairSlick.no This test is not yet approved or cleared by the [place]ir.com FDA and  has been authorized for detection and/or diagnosis of SARS-CoV-2 by FDA under an Emergency Use Authorization (EUA). This EUA will remain  in effect (meaning this test can be used) for the duration of the COVID-19 declaration under Section 56 4(b)(1) of the Act, 21 U.S.C. section 360bbb-3(b)(1), unless the authorization is terminated or revoked sooner. Performed at [facility], [address] [place], [place]   MRSA PCR Screening     Status: None   Collection Time: 06/12/19  2:42 AM   Specimen: Nasal Mucosa; Nasopharyngeal  Result Value Ref Range Status   MRSA by PCR NEGATIVE NEGATIVE Final    Comment:        The GeneXpert MRSA Assay (FDA approved for NASAL specimens only), is one component of a comprehensive MRSA colonization surveillance program. It is not intended to diagnose MRSA infection nor to guide or monitor treatment for MRSA infections. Performed at [facility], [place] [address], [place], [place] [address]          Radiology Studies: No results found.      Scheduled Meds: . aspirin EC  81 mg Oral Daily  . brinzolamide  1 drop Right Eye BID   And  . brimonidine  1 drop Right Eye BID  . docusate  100 mg Oral BID  . enoxaparin (LOVENOX) injection  40 mg Subcutaneous Q24H  . latanoprost  1 drop Both Eyes QHS  . montelukast  10 mg  Oral QHS  . multivitamin with minerals   Oral Daily   Continuous Infusions: . sodium chloride 1,000 mL (06/11/19 1428)  . sodium chloride 75 mL/hr at 06/13/19 1611  . lactated ringers 10 mL/hr at 06/12/19 2120  . methocarbamol (ROBAXIN) IV       LOS: 4 days    Time spent: 25 minutes   Bonnell Public, MD [facility]  [email or web address] Password [email or web address] 06/15/2019, 1:07 PM

## 2019-06-15 NOTE — NC FL2 (Signed)
[place] LEVEL OF CARE SCREENING TOOL     IDENTIFICATION  Patient Name: Sheri Payne Birthdate: [birth year] Sex: female Admission Date (Current Location): 06/11/2019  [facility] [place] Number:  Herbalist and Address:  [facility] [place], [place] [address] [place], [place] [address]      Provider Number: [id number]  Attending Physician Name and Address:  Bonnell Public, MD  Relative Name and Phone Number:       Current Level of Care: Hospital Recommended Level of Care: [place] Prior Approval Number:    Date Approved/Denied:   PASRR Number: [id number]  Discharge Plan: SNF    Current Diagnoses: Patient Active Problem List   Diagnosis Date Noted  . Displaced intertrochanteric fracture of left femur, initial encounter for closed fracture ([place]) 06/11/2019  . Hip fracture ([place]) 06/11/2019    Orientation RESPIRATION BLADDER Height & Weight     Self  Normal Continent, External catheter Weight: 140 lb (63.5 kg) Height:  5\' 4"  (162.6 cm)  BEHAVIORAL SYMPTOMS/MOOD NEUROLOGICAL BOWEL NUTRITION STATUS      Continent Diet(see discharge summary)  AMBULATORY STATUS COMMUNICATION OF NEEDS Skin   Extensive Assist Verbally Surgical wounds(closed incision on left hip)                       Personal Care Assistance Level of Assistance  Bathing, Feeding, Dressing Bathing Assistance: Maximum assistance Feeding assistance: Limited assistance Dressing Assistance: Maximum assistance     Functional Limitations Info  Sight, Hearing, Speech Sight Info: Adequate Hearing Info: Adequate Speech Info: Adequate    SPECIAL CARE FACTORS FREQUENCY  OT (By licensed OT), PT (By licensed PT)     PT Frequency: 5x week OT Frequency: 5x week            Contractures Contractures Info: Not present    Additional Factors Info  Code Status, Allergies Code Status Info: Full Code Allergies Info: Sulfa Antibiotics           Current Medications (06/15/2019):  This is the current hospital active medication list Current Facility-Administered Medications  Medication Dose Route Frequency Provider Last Rate Last Dose  . 0.9 %  sodium chloride infusion   Intravenous PRN Leandrew Koyanagi, MD 10 mL/hr at 06/11/19 1428 1,000 mL at 06/11/19 1428  . 0.9 %  sodium chloride infusion   Intravenous Continuous Leandrew Koyanagi, MD 75 mL/hr at 06/13/19 1611    . acetaminophen (TYLENOL) tablet 325-650 mg  325-650 mg Oral Q6H PRN Leandrew Koyanagi, MD      . acetaminophen (TYLENOL) tablet 500 mg  500 mg Oral TID PRN Leandrew Koyanagi, MD   500 mg at 06/13/19 1604  . albuterol (PROVENTIL) (2.5 MG/3ML) 0.083% nebulizer solution 3 mL  3 mL Inhalation TID PRN Leandrew Koyanagi, MD      . alum & mag hydroxide-simeth (MAALOX/MYLANTA) 200-200-20 MG/5ML suspension 30 mL  30 mL Oral Q4H PRN Leandrew Koyanagi, MD      . aspirin EC tablet 81 mg  81 mg Oral Daily Leandrew Koyanagi, MD   81 mg at 06/15/19 1040  . brinzolamide (AZOPT) 1 % ophthalmic suspension 1 drop  1 drop Right Eye BID Leandrew Koyanagi, MD   1 drop at 06/15/19 1040   And  . brimonidine (ALPHAGAN) 0.2 % ophthalmic solution 1 drop  1 drop Right Eye BID Leandrew Koyanagi, MD   1 drop at 06/15/19 1040  .  docusate (COLACE) 50 MG/5ML liquid 100 mg  100 mg Oral BID Berton [place], MD   100 mg at 06/15/19 1040  . enoxaparin (LOVENOX) injection 40 mg  40 mg Subcutaneous Q24H Tarry Kos, MD   40 mg at 06/15/19 1039  . guaiFENesin (ROBITUSSIN) 100 MG/5ML solution 200 mg  200 mg Oral TID PRN Tarry Kos, MD      . HYDROcodone-acetaminophen (NORCO) 7.5-325 MG per tablet 1-2 tablet  1-2 tablet Oral Q4H PRN Tarry Kos, MD   1 tablet at 06/13/19 0005  . HYDROcodone-acetaminophen (NORCO/VICODIN) 5-325 MG per tablet 1-2 tablet  1-2 tablet Oral Q4H PRN Tarry Kos, MD   2 tablet at 06/12/19 0146  . HYDROcodone-acetaminophen (NORCO/VICODIN) 5-325 MG per tablet 1-2 tablet  1-2 tablet Oral Q4H PRN Tarry Kos,  MD   1 tablet at 06/15/19 0330  . lactated ringers infusion   Intravenous Continuous Tarry Kos, MD 10 mL/hr at 06/12/19 2120    . latanoprost (XALATAN) 0.005 % ophthalmic solution 1 drop  1 drop Both Eyes QHS Tarry Kos, MD      . magnesium citrate solution 1 Bottle  1 Bottle Oral Once PRN Tarry Kos, MD      . menthol-cetylpyridinium (CEPACOL) lozenge 3 mg  1 lozenge Oral PRN Tarry Kos, MD       Or  . phenol (CHLORASEPTIC) mouth spray 1 spray  1 spray Mouth/Throat PRN Tarry Kos, MD      . methocarbamol (ROBAXIN) tablet 500 mg  500 mg Oral Q6H PRN Tarry Kos, MD   500 mg at 06/14/19 1552   Or  . methocarbamol (ROBAXIN) 500 mg in dextrose 5 % 50 mL IVPB  500 mg Intravenous Q6H PRN Tarry Kos, MD      . montelukast (SINGULAIR) tablet 10 mg  10 mg Oral QHS Tarry Kos, MD   10 mg at 06/14/19 2058  . morphine 2 MG/ML injection 1 mg  1 mg Intravenous Q2H PRN Tarry Kos, MD   1 mg at 06/12/19 2126  . multivitamin with minerals tablet   Oral Daily Tarry Kos, MD   1 tablet at 06/15/19 1040  . ondansetron (ZOFRAN) tablet 4 mg  4 mg Oral Q6H PRN Tarry Kos, MD       Or  . ondansetron [facility]) injection 4 mg  4 mg Intravenous Q6H PRN Tarry Kos, MD   4 mg at 06/13/19 1103  . polyethylene glycol (MIRALAX / GLYCOLAX) packet 17 g  17 g Oral Daily PRN Tarry Kos, MD      . senna-docusate (Senokot-S) tablet 1 tablet  1 tablet Oral QHS PRN Tarry Kos, MD      . sorbitol 70 % solution 30 mL  30 mL Oral Daily PRN Tarry Kos, MD         Discharge Medications: Please see discharge summary for a list of discharge medications.  Relevant Imaging Results:  Relevant Lab Results:   Additional Information [id number] [place], [place]

## 2019-06-16 LAB — SARS CORONAVIRUS 2 (TAT 6-24 HRS): SARS Coronavirus 2: NEGATIVE

## 2019-06-16 LAB — RENAL FUNCTION PANEL
Albumin: 2.5 g/dL — ABNORMAL LOW (ref 3.5–5.0)
Anion gap: 8 (ref 5–15)
BUN: 14 mg/dL (ref 8–23)
CO2: 24 mmol/L (ref 22–32)
Calcium: 8.4 mg/dL — ABNORMAL LOW (ref 8.9–10.3)
Chloride: 106 mmol/L (ref 98–111)
Creatinine, Ser: 0.56 mg/dL (ref 0.44–1.00)
GFR calc Af Amer: >60 mL/min (ref >60)
GFR calc non Af Amer: >60 mL/min (ref >60)
Glucose, Bld: 121 mg/dL — ABNORMAL HIGH (ref 70–99)
Phosphorus: 3 mg/dL (ref 2.5–4.6)
Potassium: 3.7 mmol/L (ref 3.5–5.1)
Sodium: 138 mmol/L (ref 135–145)

## 2019-06-16 LAB — MAGNESIUM: Magnesium: 2 mg/dL (ref 1.7–2.4)

## 2019-06-16 NOTE — Progress Notes (Signed)
Occupational Therapy Treatment Patient Details Name: Sheri Payne MRN: [id number] DOB: 02-21-1926 Today's Date: 06/16/2019    History of present illness Pt is a 85 y/o F admitted on 06/11/2019 after an unwittness fall at a facility & found to have an acute L hip fx. Pt underwent LLE IM nail placement on 06/12/2019.  Pt with PMH significant for glaucoma & dementia.   OT comments  Pt making minimal progress towards OT goals this session. Session focus on seated grooming and functional sit>stands from EOB. Pt required hand over hand assist and MAX A for seated grooming. Pt limited by pain affecting participation in session. Pt stating" lay me back down" when attempting to get pt to engage in seated oral care. Pt resistant to all movement needing total - MAX A +2 for bed mobility. Pt able to sit>stand from EOB x2 with hand held assist with MOD- MAX A +2. DC plan remains appropriate, will continue to follow acutely per POC.    Follow Up Recommendations  Other (comment);SNF(back to [facility] if able to provide SNF level services)    Equipment Recommendations  Other (comment)(defer to next venue)    Recommendations for Other Services      Precautions / Restrictions Precautions Precautions: Fall Precaution Comments: Pt has been resident of memory care unit for several years per her son. Restrictions Weight Bearing Restrictions: No LLE Weight Bearing: Weight bearing as tolerated       Mobility Bed Mobility Overal bed mobility: Needs Assistance Bed Mobility: Sit to Supine;Supine to Sit     Supine to sit: Total assist;+2 for physical assistance;HOB elevated Sit to supine: +2 for physical assistance;Max assist   General bed mobility comments: pt resistant to movement requiring total - MAX A +2 for bed mobility  Transfers Overall transfer level: Needs assistance Equipment used: 2 person hand held assist Transfers: Sit to/from Stand Sit to Stand: Mod assist;Max assist;+2  physical assistance;From elevated surface         General transfer comment: MOD - MAX A +2 d/t pt being resistant of movement    Balance Overall balance assessment: Needs assistance Sitting-balance support: Feet unsupported;Single extremity supported Sitting balance-Leahy Scale: Fair Sitting balance - Comments: close min guard for sitting balance   Standing balance support: Bilateral upper extremity supported Standing balance-Leahy Scale: Poor Standing balance comment: reliant on external support                           ADL either performed or assessed with clinical judgement   ADL Overall ADL's : Needs assistance/impaired     Grooming: Oral care;Sitting;Maximal assistance;Cueing for sequencing Grooming Details (indicate cue type and reason): pt required hand over hand assist to participate in seated ADL d/t agitation, pain and confusion. Pt attended to task for ~ 15 seconds before refusing to finish oral care. pt stating "let me lay down"                 Toilet Transfer: Moderate assistance;+2 for physical assistance;+2 for safety/equipment Toilet Transfer Details (indicate cue type and reason): attempted simulated toilet transfer to recliner with pt only able to tolerate x2 sit>stands from EOB with hand held assist; MOD A +2         Functional mobility during ADLs: Moderate assistance;+2 for physical assistance;+2 for safety/equipment(sit>stand with hand held assist only) General ADL Comments: Pt currently MAX- total A with most ADLs. Attempted stand pivot transfer to recliner with pt unable to pivot  feet stating, " I need to sit down" Pt required MAX A +2 for bed mobility,     Vision       Perception     Praxis      Cognition Arousal/Alertness: Awake/alert Behavior During Therapy: Agitated;Anxious Overall Cognitive Status: History of cognitive impairments - at baseline                                 General Comments: pt with hx  of dementia at baseline, pt lives in memory care unit for past ~6-7 years        Exercises     Shoulder Instructions       General Comments pt confused and crying out in pain throughout session    Pertinent Vitals/ Pain       Pain Assessment: Faces Faces Pain Scale: Hurts whole lot Pain Location: back Pain Descriptors / Indicators: Constant;Grimacing;Moaning;Sore;Restless;Discomfort Pain Intervention(s): Monitored during session;Repositioned  Home Living                                          Prior Functioning/Environment              Frequency  Min 2X/week        Progress Toward Goals  OT Goals(current goals can now be found in the care plan section)  Progress towards OT goals: Progressing toward goals  Acute Rehab OT Goals Patient Stated Goal: less pain OT Goal Formulation: With patient/family Time For Goal Achievement: 06/27/19 Potential to Achieve Goals: Good  Plan Discharge plan remains appropriate    Co-evaluation    PT/OT/SLP Co-Evaluation/Treatment: Yes Reason for Co-Treatment: Necessary to address cognition/behavior during functional activity;For patient/therapist safety;To address functional/ADL transfers   OT goals addressed during session: ADL's and self-care      AM-PAC OT "6 Clicks" Daily Activity     Outcome Measure   Help from another person eating meals?: None Help from another person taking care of personal grooming?: A Lot Help from another person toileting, which includes using toliet, bedpan, or urinal?: Total Help from another person bathing (including washing, rinsing, drying)?: Total Help from another person to put on and taking off regular upper body clothing?: Total Help from another person to put on and taking off regular lower body clothing?: Total 6 Click Score: 10    End of Session Equipment Utilized During Treatment: Gait belt  OT Visit Diagnosis: Unsteadiness on feet (R26.81);Pain;Muscle  weakness (generalized) (M62.81) Pain - Right/Left: Left Pain - part of body: Hip;Leg   Activity Tolerance Treatment limited secondary to agitation;Patient limited by pain   Patient Left in bed;with call bell/phone within reach;with bed alarm set   Nurse Communication          Time: 2956-2130 OT Time Calculation (min): 26 min  Charges: OT General Charges $OT Visit: 1 Visit OT Treatments $Self Care/Home Management : 8-22 mins  Lanier Clam., COTA/L Acute Rehabilitation Services [phone]    Sheri Payne 06/16/2019, 12:38 PM

## 2019-06-16 NOTE — Progress Notes (Signed)
PROGRESS NOTE    Sheri Payne  ZOX:[id number] DOB: 05/01/1926 DOA: 06/11/2019 PCP: System, Pcp Not In   Brief Narrative:  Per HPI: Sheri MccreedyBarbara Botelhois a 85 y.o.femalewith medical history significant ofglaucoma, mild to moderate dementia who presents after an unwitnessed fall at facility today.Patient reports she had an unwitnessed fall this morning, while trying to get to her restroom. She states she was by herself. She denies any preceding symptoms, no chest pain, no palpitations, no loss of consciousness and states she recalls the event though is unaware if she had eaten this morning. She denies any numbness or sensory deficits. She believes she just tripped and fell.  She denies any other symptoms. She states she has not had any fevers, chills, nausea/vomiting, states she is eating well and having normal bowel movements. She presently reports her L hip pain is well controlled.  11/12: Ortho plans for ORIF to L hip this afternoon. Pain controlled.  11/13: Pain appears to be well controlled and patient has undergone left hip ORIF per orthopedics on 11/12.  No complications noted.  Awaiting therapies and placement.  06/16/2019: Patient is stable.  Awaiting disposition.  Pursue disposition.  Assessment & Plan:   Principal Problem:   Displaced intertrochanteric fracture of left femur, initial encounter for closed fracture [facility](HCC) Active Problems:   Hip fracture (HCC)   Traumatic fracture of L. Femur status post intramedullary implant 11/12 -Diet has been advanced -Therapies are pending -Continue on Lovenox for 2 weeks as noted -Discharge when rehab bed arranged  Hypokalemia:  -Potassium is 3.4 today (06/14/2019) -Continue to monitor and replete.   Glaucoma - continue eye gtts  Dementia - delirium precautions   DVT prophylaxis:Lovenox Code Status: Full Family Communication: None at bedside Disposition Plan:  Therapy is currently pending.  Patient is  status post intramedullary nail on 11/12.   Consultants:   Orthopedics  Procedures:   Intramedullary implant of left femur fracture 11/12.  Antimicrobials:  Anti-infectives (From admission, onward)   Start     Dose/Rate Route Frequency Ordered Stop   06/12/19 1930  ceFAZolin (ANCEF) IVPB 2g/100 mL premix     2 g 200 mL/hr over 30 Minutes Intravenous Every 8 hours 06/12/19 1814 06/13/19 1115   06/12/19 1421  ceFAZolin (ANCEF) 2-4 GM/100ML-% IVPB  Status:  Discontinued    Note to Pharmacy: Janene HarveyMurray, Karol   : cabinet override      06/12/19 1421 06/12/19 1501   06/12/19 1400  ceFAZolin (ANCEF) IVPB 2g/100 mL premix     2 g 200 mL/hr over 30 Minutes Intravenous On call to O.R. 06/11/19 2049 06/12/19 1535      Subjective: Patient seen No new complaints Pursue disposition when a bed is available.   Objective: Vitals:   06/15/19 1542 06/15/19 1923 06/16/19 0232 06/16/19 0830  BP: 136/79 140/77 (!) 117/56 (!) 146/77  Pulse: 85 96 88 84  Resp: 16 13 16 16   Temp: 98.2 F (36.8 C) 98.8 F (37.1 C) 98.3 F (36.8 C) 98.2 F (36.8 C)  TempSrc: Oral Oral Oral Oral  SpO2: 95% 95% 96% 97%  Weight:      Height:        Intake/Output Summary (Last 24 hours) at 06/16/2019 1038 Last data filed at 06/16/2019 0900 Gross per 24 hour  Intake 180 ml  Output 750 ml  Net -570 ml   Filed Weights   06/12/19 1439  Weight: 63.5 kg    Examination:  General exam: Appears calm and comfortable  Respiratory system:  Clear to auscultation.  Cardiovascular system: S1 & S2 heard Gastrointestinal system: Abdomen is nondistended, soft and nontender. No organomegaly or masses felt. Normal bowel sounds heard. Central nervous system: Alert and oriented.  Patient moves all extremities   Data Reviewed: I have personally reviewed following labs and imaging studies  CBC: Recent Labs  Lab 06/11/19 1014 06/13/19 0410 06/14/19 0336 06/15/19 0314  WBC 12.4* 10.6* 9.0 8.2  NEUTROABS 8.1*  --    --   --   HGB 13.0 10.5* 9.6* 8.9*  HCT 39.5 31.8* 28.6* 26.5*  MCV 95.2 96.1 93.8 94.3  PLT 242 127* 134* 165   Basic Metabolic Panel: Recent Labs  Lab 06/11/19 1014 06/13/19 0410 06/14/19 0336 06/16/19 0600  NA 138 135 136 138  K 3.1* 3.9 3.4* 3.7  CL 107 107 105 106  CO2 22 18* 23 24  GLUCOSE 178* 116* 111* 121*  BUN 10 9 8 14   CREATININE 0.84 0.68 0.73 0.56  CALCIUM 8.7* 7.9* 8.0* 8.4*  MG  --  1.8  --  2.0  PHOS  --   --   --  3.0   GFR: Estimated Creatinine Clearance: 39.6 mL/min (by C-G formula based on SCr of 0.56 mg/dL). Liver Function Tests: Recent Labs  Lab 06/16/19 0600  ALBUMIN 2.5*   No results for input(s): LIPASE, AMYLASE in the last 168 hours. No results for input(s): AMMONIA in the last 168 hours. Coagulation Profile: Recent Labs  Lab 06/11/19 1014  INR 1.1   Cardiac Enzymes: No results for input(s): CKTOTAL, CKMB, CKMBINDEX, TROPONINI in the last 168 hours. BNP (last 3 results) No results for input(s): PROBNP in the last 8760 hours. HbA1C: No results for input(s): HGBA1C in the last 72 hours. CBG: No results for input(s): GLUCAP in the last 168 hours. Lipid Profile: No results for input(s): CHOL, HDL, LDLCALC, TRIG, CHOLHDL, LDLDIRECT in the last 72 hours. Thyroid Function Tests: No results for input(s): TSH, T4TOTAL, FREET4, T3FREE, THYROIDAB in the last 72 hours. Anemia Panel: No results for input(s): VITAMINB12, FOLATE, FERRITIN, TIBC, IRON, RETICCTPCT in the last 72 hours. Sepsis Labs: No results for input(s): PROCALCITON, LATICACIDVEN in the last 168 hours.  Recent Results (from the past 240 hour(s))  SARS CORONAVIRUS 2 (TAT 6-24 HRS) Nasopharyngeal Nasopharyngeal Swab     Status: None   Collection Time: 06/11/19  1:20 PM   Specimen: Nasopharyngeal Swab  Result Value Ref Range Status   SARS Coronavirus 2 NEGATIVE NEGATIVE Final    Comment: (NOTE) SARS-CoV-2 target nucleic acids are NOT DETECTED. The SARS-CoV-2 RNA is generally  detectable in upper and lower respiratory specimens during the acute phase of infection. Negative results do not preclude SARS-CoV-2 infection, do not rule out co-infections with other pathogens, and should not be used as the sole basis for treatment or other patient management decisions. Negative results must be combined with clinical observations, patient history, and epidemiological information. The expected result is Negative. Fact Sheet for Patients: 13/11/20 Fact Sheet for Healthcare Providers: HairSlick.no This test is not yet approved or cleared by the quierodirigir.com FDA and  has been authorized for detection and/or diagnosis of SARS-CoV-2 by FDA under an Emergency Use Authorization (EUA). This EUA will remain  in effect (meaning this test can be used) for the duration of the COVID-19 declaration under Section 56 4(b)(1) of the Act, 21 U.S.C. section 360bbb-3(b)(1), unless the authorization is terminated or revoked sooner. Performed at [facility], [address] [place], [place]  [address]   MRSA PCR Screening     Status: None   Collection Time: 06/12/19  2:42 AM   Specimen: Nasal Mucosa; Nasopharyngeal  Result Value Ref Range Status   MRSA by PCR NEGATIVE NEGATIVE Final    Comment:        The GeneXpert MRSA Assay (FDA approved for NASAL specimens only), is one component of a comprehensive MRSA colonization surveillance program. It is not intended to diagnose MRSA infection nor to guide or monitor treatment for MRSA infections. Performed at [facility], [place] [address], [place], [place] [address]          Radiology Studies: No results found.      Scheduled Meds: . aspirin EC  81 mg Oral Daily  . brinzolamide  1 drop Right Eye BID   And  . brimonidine  1 drop Right Eye BID  . docusate  100 mg Oral BID  . enoxaparin (LOVENOX) injection  40 mg Subcutaneous Q24H  . latanoprost  1  drop Both Eyes QHS  . montelukast  10 mg Oral QHS  . multivitamin with minerals   Oral Daily   Continuous Infusions: . sodium chloride 1,000 mL (06/11/19 1428)  . sodium chloride 75 mL/hr at 06/13/19 1611  . lactated ringers 10 mL/hr at 06/12/19 2120  . methocarbamol (ROBAXIN) IV       LOS: 5 days    Time spent: 25 minutes   Bonnell Public, MD [facility]  [email or web address] Password [facility] 06/16/2019, 10:38 AM

## 2019-06-16 NOTE — Progress Notes (Signed)
Physical Therapy Treatment Patient Details Name: Sheri Payne MRN: [id number] DOB: 1926/02/21 Today's Date: 06/16/2019    History of Present Illness Pt is a 85 y/o F admitted on 06/11/2019 after an unwittness fall at a facility & found to have an acute L hip fx. Pt underwent LLE IM nail placement on 06/12/2019.  Pt with PMH significant for glaucoma & dementia.    PT Comments    Pt awake and alert but not oriented. Pt able to perform bil LE HEP supine with no pain on RLE and pain reported with hip abduct/ADD but with mobility pt reporting low back pain with pillow placed behind her in chair position in bed end of session. Pt with increased tolerance for mobility this session but continues to lack awareness of injury or ability to understand benefit of mobility. D/C plan remains appropriate.    Follow Up Recommendations  Supervision/Assistance - 24 hour;SNF     Equipment Recommendations  None recommended by PT    Recommendations for Other Services       Precautions / Restrictions Precautions Precautions: Fall Precaution Comments: Pt has been resident of memory care unit for several years per her son. Restrictions Weight Bearing Restrictions: Yes LLE Weight Bearing: Weight bearing as tolerated    Mobility  Bed Mobility Overal bed mobility: Needs Assistance Bed Mobility: Supine to Sit;Sit to Supine     Supine to sit: Total assist;+2 for physical assistance;HOB elevated Sit to supine: +2 for physical assistance;Max assist   General bed mobility comments: pt with physical assist to move legs and trunk to transition to sitting EOB with use of pad. Return to bed with max cues pt able to prop on forearm and lift RLE with assist for trunk and LLE max +2  Transfers Overall transfer level: Needs assistance Equipment used: 2 person hand held assist Transfers: Sit to/from Stand Sit to Stand: Max assist;+2 physical assistance         General transfer comment: max +2 for 3  attempts to lift sacrum from surface with bil UE support. Pt maintains flexed trunk and will not fully extend or attempt to shift weight. Sitting EOB max +2 to scoot toward [facility]  Ambulation/Gait             General Gait Details: unable   Stairs             Wheelchair Mobility    Modified Rankin (Stroke Patients Only)       Balance Overall balance assessment: Needs assistance Sitting-balance support: Feet supported;No upper extremity supported;Single extremity supported Sitting balance-Leahy Scale: Fair Sitting balance - Comments: supervision for sitting EOB with single UE and no UE support   Standing balance support: Bilateral upper extremity supported Standing balance-Leahy Scale: Poor Standing balance comment: reliant on external support                            Cognition Arousal/Alertness: Awake/alert Behavior During Therapy: Anxious Overall Cognitive Status: No family/caregiver present to determine baseline cognitive functioning                                 General Comments: pt with hx of dementia at baseline, pt lives in memory care unit for past ~6-7 years. Pt stating she is 60 and "mama, daddy come get me"      Exercises General Exercises - Lower Extremity Short Arc Quad: AAROM;Both;Supine;10 reps  Heel Slides: AAROM;Right;Left;10 reps;Supine Hip ABduction/ADduction: AAROM;PROM;Right;Left;Supine;10 reps(PROM on LLE)    General Comments General comments (skin integrity, edema, etc.): pt confused and crying out in pain throughout session      Pertinent Vitals/Pain Pain Assessment: Faces Faces Pain Scale: Hurts whole lot Pain Location: lower back with transfers Pain Descriptors / Indicators: Guarding;Grimacing;Restless Pain Intervention(s): Monitored during session;Repositioned;Premedicated before session;Limited activity within patient's tolerance    Home Living                      Prior Function             PT Goals (current goals can now be found in the care plan section) Acute Rehab PT Goals Patient Stated Goal: less pain Progress towards PT goals: Progressing toward goals    Frequency    Min 2X/week      PT Plan Current plan remains appropriate;Frequency needs to be updated    Co-evaluation PT/OT/SLP Co-Evaluation/Treatment: Yes Reason for Co-Treatment: Complexity of the patient's impairments (multi-system involvement);For patient/therapist safety PT goals addressed during session: Mobility/safety with mobility OT goals addressed during session: ADL's and self-care      AM-PAC PT "6 Clicks" Mobility   Outcome Measure  Help needed turning from your back to your side while in a flat bed without using bedrails?: Total Help needed moving from lying on your back to sitting on the side of a flat bed without using bedrails?: Total Help needed moving to and from a bed to a chair (including a wheelchair)?: Total Help needed standing up from a chair using your arms (e.g., wheelchair or bedside chair)?: Total Help needed to walk in hospital room?: Total Help needed climbing 3-5 steps with a railing? : Total 6 Click Score: 6    End of Session Equipment Utilized During Treatment: Gait belt Activity Tolerance: Patient tolerated treatment well Patient left: in bed;with call bell/phone within reach;with SCD's reapplied;with bed alarm set Nurse Communication: Mobility status;Precautions PT Visit Diagnosis: Unsteadiness on feet (R26.81);Muscle weakness (generalized) (M62.81);Difficulty in walking, not elsewhere classified (R26.2)     Time: 4967-5916 PT Time Calculation (min) (ACUTE ONLY): 30 min  Charges:  $Therapeutic Activity: 8-22 mins                     Dung Prien P, PT Acute Rehabilitation Services Pager: [phone] Office: [place] 06/16/2019, 1:09 PM

## 2019-06-16 NOTE — TOC Initial Note (Signed)
Transition of Care [facility]) - Initial/Assessment Note    Patient Details  Name: Kamaryn Grimley MRN: [id number] Date of Birth: Nov 05, 1925  Transition of Care [facility]) CM/SW Contact:    Atilano Median, LCSW Phone Number: 06/16/2019, 1:27 PM  Clinical Narrative:                 CSW met with patient to discuss discharge planning. Patient aware and agreeable to this plan, but states that she would like this CSW to discuss dc planning with her son Legrand Como. CSW spoke with Legrand Como and he states that he would prefer if the patient returned back to [facility] as they have stated that they are able to provide the level of care that she requires. Patient will dc back to [place] pending negative COVID test. Anticipate discharge on 11/17.  Expected Discharge Plan: Assisted Living Barriers to Discharge: Continued Medical Work up   Patient Goals and CMS Choice Patient states their goals for this hospitalization and ongoing recovery are:: return to my home (carriage house)      Expected Discharge Plan and Services Expected Discharge Plan: Assisted Living       Living arrangements for the past 2 months: [place]                                      Prior Living Arrangements/Services Living arrangements for the past 2 months: [place] Lives with:: Facility Resident   Do you feel safe going back to the place where you live?: Yes      Need for Family Participation in Patient Care: Yes (Comment) Care giver support system in place?: Yes (comment)      Activities of Daily Living Home Assistive Devices/Equipment: Eyeglasses, Grab bars around toilet, Grab bars in shower, Hand-held shower hose, Walker (specify type), Hospital bed, Blood pressure cuff, Scales, Wheelchair(carriage house has necessary equipment for their residents) ADL Screening (condition at time of admission) Patient's cognitive ability adequate to safely complete daily activities?:  Yes Is the patient deaf or have difficulty hearing?: Yes(slight hoh) Does the patient have difficulty seeing, even when wearing glasses/contacts?: Yes(patient has glaucoma) Does the patient have difficulty concentrating, remembering, or making decisions?: Yes Patient able to express need for assistance with ADLs?: Yes Does the patient have difficulty dressing or bathing?: Yes Independently performs ADLs?: No Communication: Independent Dressing (OT): Needs assistance Is this a change from baseline?: Change from baseline, expected to last >3 days Grooming: Needs assistance Is this a change from baseline?: Change from baseline, expected to last >3 days Feeding: Needs assistance Is this a change from baseline?: Change from baseline, expected to last >3 days Bathing: Needs assistance Is this a change from baseline?: Change from baseline, expected to last >3 days Toileting: Dependent Is this a change from baseline?: Change from baseline, expected to last >3days In/Out Bed: Dependent Is this a change from baseline?: Change from baseline, expected to last >3 days Walks in Home: Dependent Is this a change from baseline?: Change from baseline, expected to last >3 days Does the patient have difficulty walking or climbing stairs?: Yes("does not climb or go down stairs") Weakness of Legs: Both Weakness of Arms/Hands: None  Permission Sought/Granted Permission sought to share information with : Family Supports Permission granted to share information with : Yes, Verbal Permission Granted  Share Information with NAME: Jaryiah Mehlman     Permission granted to share  info w Relationship: son  Permission granted to share info w Contact Information: [phone]  Emotional Assessment Appearance:: Appears stated age Attitude/Demeanor/Rapport: Gracious Affect (typically observed): Appropriate Orientation: : Oriented to Self, Oriented to Place, Oriented to  Time, Oriented to Situation       Admission diagnosis:  Displaced intertrochanteric fracture of left femur, initial encounter for closed fracture [facility]) [S72.142A] Patient Active Problem List   Diagnosis Date Noted  . Displaced intertrochanteric fracture of left femur, initial encounter for closed fracture ([place]) 06/11/2019  . Hip fracture ([place]) 06/11/2019   PCP:  System, Pcp Not In Pharmacy:  No Pharmacies Listed    Social Determinants of Health (SDOH) Interventions    Readmission Risk Interventions No flowsheet data found.

## 2019-06-16 NOTE — Plan of Care (Signed)

## 2019-06-17 MED ORDER — APIXABAN 2.5 MG PO TABS: 2.5000 mg | ORAL_TABLET | Freq: Two times a day (BID) | ORAL | 0 refills | Status: AC

## 2019-06-17 MED ORDER — APIXABAN 2.5 MG PO TABS: 2.5000 mg | ORAL_TABLET | Freq: Two times a day (BID) | ORAL | 0 refills | Status: DC

## 2019-06-17 MED ORDER — BISACODYL 10 MG RE SUPP
10.0000 mg | Freq: Once | RECTAL | Status: AC
  Administered 2019-06-17: 10 mg via RECTAL
  Filled 2019-06-17: qty 1

## 2019-06-17 NOTE — Progress Notes (Signed)
Pt was discharged to ALF [facility]) via [place]. Discharge packet was given to [place].

## 2019-06-17 NOTE — Plan of Care (Signed)
  Problem: Pain Managment: Goal: General experience of comfort will improve Outcome: Progressing   Problem: Safety: Goal: Ability to remain free from injury will improve Outcome: Progressing   Problem: Skin Integrity: Goal: Risk for impaired skin integrity will decrease Outcome: Progressing   

## 2019-06-17 NOTE — NC FL2 (Addendum)
MEDICAID FL2 LEVEL OF CARE SCREENING TOOL     IDENTIFICATION  Patient Name: Sheri Payne Birthdate: April 08, 1926 Sex: female Admission Date (Current Location): 06/11/2019  [facility] and [place] Number:  Producer, television/film/video and Address:  The [place], [address] [place], [place] [address]      Provider Number: [id number]  Attending Physician Name and Address:  Pennie Banter, DO  Relative Name and Phone Number:       Current Level of Care: Hospital Recommended Level of Care: Memory Care Prior Approval Number:    Date Approved/Denied:   PASRR Number: [id number]  Discharge Plan: Other (Comment)(Memory Care)    Current Diagnoses: Patient Active Problem List   Diagnosis Date Noted  . Displaced intertrochanteric fracture of left femur, initial encounter for closed fracture ([facility]) 06/11/2019  . Hip fracture ([facility]) 06/11/2019    Orientation RESPIRATION BLADDER Height & Weight     Self  Normal Continent Weight: 127 lb 3.3 oz (57.7 kg) Height:  5\' 4"  (162.6 cm)  BEHAVIORAL SYMPTOMS/MOOD NEUROLOGICAL BOWEL NUTRITION STATUS      Continent Diet(see discharge summary)  AMBULATORY STATUS COMMUNICATION OF NEEDS Skin   Extensive Assist Verbally Surgical wounds(closed incision on left hip)                       Personal Care Assistance Level of Assistance  Bathing, Feeding, Dressing Bathing Assistance: Maximum assistance Feeding assistance: Limited assistance Dressing Assistance: Maximum assistance     Functional Limitations Info  Sight, Hearing, Speech Sight Info: Adequate Hearing Info: Adequate Speech Info: Adequate    SPECIAL CARE FACTORS FREQUENCY  OT (By licensed OT), PT (By licensed PT)     PT Frequency: eval and treat OT Frequency: eval and treat            Contractures Contractures Info: Not present    Additional Factors Info  Code Status, Allergies Code Status Info: Full Code Allergies Info: Sulfa  Antibiotics           Current Medications (06/17/2019):  This is the current hospital active medication list Current Facility-Administered Medications  Medication Dose Route Frequency Provider Last Rate Last Dose  . 0.9 %  sodium chloride infusion   Intravenous PRN 06/19/2019, MD 10 mL/hr at 06/11/19 1428 1,000 mL at 06/11/19 1428  . 0.9 %  sodium chloride infusion   Intravenous Continuous 13/11/20, MD 75 mL/hr at 06/13/19 1611    . acetaminophen (TYLENOL) tablet 325-650 mg  325-650 mg Oral Q6H PRN 06/15/19, MD      . acetaminophen (TYLENOL) tablet 500 mg  500 mg Oral TID PRN Tarry Kos, MD   500 mg at 06/13/19 1604  . albuterol (PROVENTIL) (2.5 MG/3ML) 0.083% nebulizer solution 3 mL  3 mL Inhalation TID PRN 06/15/19, MD      . alum & mag hydroxide-simeth (MAALOX/MYLANTA) 200-200-20 MG/5ML suspension 30 mL  30 mL Oral Q4H PRN 02-06-2001, MD      . aspirin EC tablet 81 mg  81 mg Oral Daily Tarry Kos, MD   81 mg at 06/17/19 0939  . bisacodyl (DULCOLAX) suppository 10 mg  10 mg Rectal Once 06/19/19 A, DO      . brinzolamide (AZOPT) 1 % ophthalmic suspension 1 drop  1 drop Right Eye BID Esaw Grandchild, MD   1 drop at 06/17/19 0939   And  . brimonidine (  ALPHAGAN) 0.2 % ophthalmic solution 1 drop  1 drop Right Eye BID Leandrew Koyanagi, MD   1 drop at 06/17/19 0940  . docusate (COLACE) 50 MG/5ML liquid 100 mg  100 mg Oral BID Dana Allan I, MD   100 mg at 06/17/19 0939  . enoxaparin (LOVENOX) injection 40 mg  40 mg Subcutaneous Q24H Leandrew Koyanagi, MD   40 mg at 06/17/19 0939  . guaiFENesin (ROBITUSSIN) 100 MG/5ML solution 200 mg  200 mg Oral TID PRN Leandrew Koyanagi, MD      . HYDROcodone-acetaminophen (NORCO) 7.5-325 MG per tablet 1-2 tablet  1-2 tablet Oral Q4H PRN Leandrew Koyanagi, MD   1 tablet at 06/13/19 0005  . HYDROcodone-acetaminophen (NORCO/VICODIN) 5-325 MG per tablet 1-2 tablet  1-2 tablet Oral Q4H PRN Leandrew Koyanagi, MD   2 tablet at 06/12/19 0146  .  HYDROcodone-acetaminophen (NORCO/VICODIN) 5-325 MG per tablet 1-2 tablet  1-2 tablet Oral Q4H PRN Leandrew Koyanagi, MD   1 tablet at 06/16/19 0102  . lactated ringers infusion   Intravenous Continuous Leandrew Koyanagi, MD 10 mL/hr at 06/12/19 2120    . latanoprost (XALATAN) 0.005 % ophthalmic solution 1 drop  1 drop Both Eyes QHS Leandrew Koyanagi, MD   1 drop at 06/16/19 2223  . magnesium citrate solution 1 Bottle  1 Bottle Oral Once PRN Leandrew Koyanagi, MD      . menthol-cetylpyridinium (CEPACOL) lozenge 3 mg  1 lozenge Oral PRN Leandrew Koyanagi, MD       Or  . phenol (CHLORASEPTIC) mouth spray 1 spray  1 spray Mouth/Throat PRN Leandrew Koyanagi, MD      . methocarbamol (ROBAXIN) tablet 500 mg  500 mg Oral Q6H PRN Leandrew Koyanagi, MD   500 mg at 06/14/19 1552   Or  . methocarbamol (ROBAXIN) 500 mg in dextrose 5 % 50 mL IVPB  500 mg Intravenous Q6H PRN Leandrew Koyanagi, MD      . montelukast (SINGULAIR) tablet 10 mg  10 mg Oral QHS Leandrew Koyanagi, MD   10 mg at 06/16/19 2218  . morphine 2 MG/ML injection 1 mg  1 mg Intravenous Q2H PRN Leandrew Koyanagi, MD   1 mg at 06/12/19 2126  . multivitamin with minerals tablet   Oral Daily Leandrew Koyanagi, MD   1 tablet at 06/17/19 7253  . ondansetron (ZOFRAN) tablet 4 mg  4 mg Oral Q6H PRN Leandrew Koyanagi, MD       Or  . ondansetron [facility]) injection 4 mg  4 mg Intravenous Q6H PRN Leandrew Koyanagi, MD   4 mg at 06/13/19 1103  . polyethylene glycol (MIRALAX / GLYCOLAX) packet 17 g  17 g Oral Daily PRN Leandrew Koyanagi, MD   17 g at 06/17/19 0939  . senna-docusate (Senokot-S) tablet 1 tablet  1 tablet Oral QHS PRN Leandrew Koyanagi, MD      . sorbitol 70 % solution 30 mL  30 mL Oral Daily PRN Leandrew Koyanagi, MD         Discharge Medications: Please see discharge summary for a list of discharge medications.  Relevant Imaging Results:  Relevant Lab Results:   Additional Information SS#[id number] [facility] [place], [place]

## 2019-06-17 NOTE — NC FL2 (Signed)
[place] MEDICAID FL2 LEVEL OF CARE SCREENING TOOL     IDENTIFICATION  Patient Name: Sheri Payne Birthdate: September 21, 1925 Sex: female Admission Date (Current Location): 06/11/2019  [facility] and [place] Number:  Producer, television/film/video and Address:  The [place], [address] [place], [place] [address]      Provider Number: [id number]  Attending Physician Name and Address:  Pennie Banter, DO  Relative Name and Phone Number:       Current Level of Care: Hospital Recommended Level of Care: Memory Care Prior Approval Number:    Date Approved/Denied:   PASRR Number: [id number]  Discharge Plan: Other (Comment)(Memory Care)    Current Diagnoses: Patient Active Problem List   Diagnosis Date Noted  . Displaced intertrochanteric fracture of left femur, initial encounter for closed fracture (HCC) 06/11/2019  . Hip fracture ([facility]) 06/11/2019    Orientation RESPIRATION BLADDER Height & Weight     Self  Normal Continent, External catheter Weight: 127 lb 3.3 oz (57.7 kg) Height:  5\' 4"  (162.6 cm)  BEHAVIORAL SYMPTOMS/MOOD NEUROLOGICAL BOWEL NUTRITION STATUS      Continent Diet(see discharge summary)  AMBULATORY STATUS COMMUNICATION OF NEEDS Skin   Extensive Assist Verbally Surgical wounds(closed incision on left hip)                       Personal Care Assistance Level of Assistance  Bathing, Feeding, Dressing Bathing Assistance: Maximum assistance Feeding assistance: Limited assistance Dressing Assistance: Maximum assistance     Functional Limitations Info  Sight, Hearing, Speech Sight Info: Adequate Hearing Info: Adequate Speech Info: Adequate    SPECIAL CARE FACTORS FREQUENCY  OT (By licensed OT), PT (By licensed PT)     PT Frequency: eval and treat OT Frequency: eval and treat            Contractures Contractures Info: Not present    Additional Factors Info  Code Status, Allergies Code Status Info: Full Code Allergies  Info: Sulfa Antibiotics           Current Medications (06/17/2019):  This is the current hospital active medication list Current Facility-Administered Medications  Medication Dose Route Frequency Provider Last Rate Last Dose  . 0.9 %  sodium chloride infusion   Intravenous PRN 06/19/2019, MD 10 mL/hr at 06/11/19 1428 1,000 mL at 06/11/19 1428  . 0.9 %  sodium chloride infusion   Intravenous Continuous 13/11/20, MD 75 mL/hr at 06/13/19 1611    . acetaminophen (TYLENOL) tablet 325-650 mg  325-650 mg Oral Q6H PRN 06/15/19, MD      . acetaminophen (TYLENOL) tablet 500 mg  500 mg Oral TID PRN Tarry Kos, MD   500 mg at 06/13/19 1604  . albuterol (PROVENTIL) (2.5 MG/3ML) 0.083% nebulizer solution 3 mL  3 mL Inhalation TID PRN 06/15/19, MD      . alum & mag hydroxide-simeth (MAALOX/MYLANTA) 200-200-20 MG/5ML suspension 30 mL  30 mL Oral Q4H PRN 02-06-2001, MD      . aspirin EC tablet 81 mg  81 mg Oral Daily Tarry Kos, MD   81 mg at 06/17/19 0939  . brinzolamide (AZOPT) 1 % ophthalmic suspension 1 drop  1 drop Right Eye BID 06/19/19, MD   1 drop at 06/17/19 0939   And  . brimonidine (ALPHAGAN) 0.2 % ophthalmic solution 1 drop  1 drop Right Eye BID 06/19/19, MD  1 drop at 06/17/19 0940  . docusate (COLACE) 50 MG/5ML liquid 100 mg  100 mg Oral BID Dana Allan I, MD   100 mg at 06/17/19 0939  . enoxaparin (LOVENOX) injection 40 mg  40 mg Subcutaneous Q24H Leandrew Koyanagi, MD   40 mg at 06/17/19 0939  . guaiFENesin (ROBITUSSIN) 100 MG/5ML solution 200 mg  200 mg Oral TID PRN Leandrew Koyanagi, MD      . HYDROcodone-acetaminophen (NORCO) 7.5-325 MG per tablet 1-2 tablet  1-2 tablet Oral Q4H PRN Leandrew Koyanagi, MD   1 tablet at 06/13/19 0005  . HYDROcodone-acetaminophen (NORCO/VICODIN) 5-325 MG per tablet 1-2 tablet  1-2 tablet Oral Q4H PRN Leandrew Koyanagi, MD   2 tablet at 06/12/19 0146  . HYDROcodone-acetaminophen (NORCO/VICODIN) 5-325 MG per tablet 1-2 tablet  1-2  tablet Oral Q4H PRN Leandrew Koyanagi, MD   1 tablet at 06/16/19 4562  . lactated ringers infusion   Intravenous Continuous Leandrew Koyanagi, MD 10 mL/hr at 06/12/19 2120    . latanoprost (XALATAN) 0.005 % ophthalmic solution 1 drop  1 drop Both Eyes QHS Leandrew Koyanagi, MD   1 drop at 06/16/19 2223  . magnesium citrate solution 1 Bottle  1 Bottle Oral Once PRN Leandrew Koyanagi, MD      . menthol-cetylpyridinium (CEPACOL) lozenge 3 mg  1 lozenge Oral PRN Leandrew Koyanagi, MD       Or  . phenol (CHLORASEPTIC) mouth spray 1 spray  1 spray Mouth/Throat PRN Leandrew Koyanagi, MD      . methocarbamol (ROBAXIN) tablet 500 mg  500 mg Oral Q6H PRN Leandrew Koyanagi, MD   500 mg at 06/14/19 1552   Or  . methocarbamol (ROBAXIN) 500 mg in dextrose 5 % 50 mL IVPB  500 mg Intravenous Q6H PRN Leandrew Koyanagi, MD      . montelukast (SINGULAIR) tablet 10 mg  10 mg Oral QHS Leandrew Koyanagi, MD   10 mg at 06/16/19 2218  . morphine 2 MG/ML injection 1 mg  1 mg Intravenous Q2H PRN Leandrew Koyanagi, MD   1 mg at 06/12/19 2126  . multivitamin with minerals tablet   Oral Daily Leandrew Koyanagi, MD   1 tablet at 06/17/19 5638  . ondansetron (ZOFRAN) tablet 4 mg  4 mg Oral Q6H PRN Leandrew Koyanagi, MD       Or  . ondansetron [facility]) injection 4 mg  4 mg Intravenous Q6H PRN Leandrew Koyanagi, MD   4 mg at 06/13/19 1103  . polyethylene glycol (MIRALAX / GLYCOLAX) packet 17 g  17 g Oral Daily PRN Leandrew Koyanagi, MD   17 g at 06/17/19 0939  . senna-docusate (Senokot-S) tablet 1 tablet  1 tablet Oral QHS PRN Leandrew Koyanagi, MD      . sorbitol 70 % solution 30 mL  30 mL Oral Daily PRN Leandrew Koyanagi, MD         Discharge Medications: Please see discharge summary for a list of discharge medications.  Relevant Imaging Results:  Relevant Lab Results:   Additional Information SS[id number] [place], [place]

## 2019-06-17 NOTE — Discharge Summary (Addendum)
Physician Discharge Summary  Sheri Payne [id number] DOB: September 18, 1925 DOA: 06/11/2019  PCP: System, Pcp Not In  Admit date: 06/11/2019 Discharge date: 06/17/2019  Admitted From: ALF ([facility]) Disposition:  ALF ([facility])  Recommendations for Outpatient Follow-up:  1. Follow up with PCP in 1-2 weeks 2. Please obtain BMP/CBC in one week 3. Please follow up with Orthopedics 2 weeks post-op (surgery 06/12/19)  Home Health: No Equipment/Devices: [facility] has necessary equipment   Discharge Condition: Stable CODE STATUS: FULL  Diet recommendation: Regular   Brief/Interim Summary:  85 y.o.femalewith medical history significant ofglaucoma, mild to moderate dementia who presents after an unwitnessed fall at facility.Patient reports she had an unwitnessed fall this morning, while trying to get to her restroom. She denies any preceding symptoms, no chest pain, no palpitations, no loss of consciousness and states she recalls the event. She denies any numbness or sensory deficits. She believes she just tripped and fell.  She denies any other symptoms. She states she has not had any fevers, chills, nausea/vomiting, states she is eating well and having normal bowel movements. She presently reports her L hip pain is well controlled.  Ortho was consulted and patient underwent surgery ORIF to L hip on 06/12/19. Pain controlled post-operatively.  PT evaluated patient and recommended 24-hour supervision/assistance at SNF.  CSW spoke with patient's son who prefers patient return to [facility], and he was able to confirm appropriate level of care could be met there.  Patient clinically improved and medically stable to discharge today.  For orthopedic follow-up 2 weeks post-op.  She is to continue on Lovenox injections for DVT prophylaxis.    ADDENDUM:  DVT prophylaxis changed to Eliquis, as ALF unable to do Lovenox injections.  Discharge Diagnoses: Principal Problem:    Displaced intertrochanteric fracture of left femur, initial encounter for closed fracture [facility]) Active Problems:   Hip fracture [facility])    Discharge Instructions   Discharge Instructions    Call MD for:  redness, tenderness, or signs of infection (pain, swelling, redness, odor or green/yellow discharge around incision site)   Complete by: As directed    Call MD for:  severe uncontrolled pain   Complete by: As directed    Call MD for:  temperature >100.4   Complete by: As directed    Diet - low sodium heart healthy   Complete by: As directed    Discharge instructions   Complete by: As directed    Continue Lovenox injections daily for DVT prevention, per Ortho. Weight-bearing as tolerated on left lower extremity. Follow up with Ortho at 2 weeks post-op (surgery 06/12/19).   Increase activity slowly   Complete by: As directed    Weight bearing as tolerated   Complete by: As directed      Allergies as of 06/17/2019      Reactions   Sulfa Antibiotics Rash   Other reaction(s): Intolerance      Medication List    TAKE these medications   acetaminophen 500 MG tablet Commonly known as: TYLENOL Take 500 mg by mouth 3 (three) times daily as needed for mild pain, fever or headache.   albuterol 108 (90 Base) MCG/ACT inhaler Commonly known as: VENTOLIN HFA Inhale 2 puffs into the lungs 3 (three) times daily as needed for wheezing.   apixaban 2.5 MG Tabs tablet Commonly known as: Eliquis Take 1 tablet (2.5 mg total) by mouth 2 (two) times daily for 14 days.   aspirin EC 81 MG tablet Take 81 mg by  mouth daily.   CENTRUM SILVER 50+WOMEN PO Take 1 tablet by mouth daily.   guaifenesin 100 MG/5ML syrup Commonly known as: ROBITUSSIN Take 200 mg by mouth 3 (three) times daily as needed for cough.   HYDROcodone-acetaminophen 7.5-325 MG tablet Commonly known as: Norco Take 1-2 tablets by mouth 3 (three) times daily as needed for moderate pain.   latanoprost 0.005 % ophthalmic  solution Commonly known as: XALATAN Place 1 drop into both eyes at bedtime.   montelukast 10 MG tablet Commonly known as: SINGULAIR Take 10 mg by mouth at bedtime.   polyethylene glycol 17 g packet Commonly known as: MIRALAX / GLYCOLAX Take 17 g by mouth daily as needed for mild constipation.   Simbrinza 1-0.2 % Susp Generic drug: Brinzolamide-Brimonidine Place 1 drop into the right eye 2 (two) times daily.            Discharge Care Instructions  (From admission, onward)         Start     Ordered   06/12/19 0000  Weight bearing as tolerated     06/12/19 1555         Follow-up Information    Leandrew Koyanagi, MD In 2 weeks.   Specialty: Orthopedic Surgery Why: For suture removal, For wound re-check Contact information: [place] [phone]          Allergies  Allergen Reactions  . Sulfa Antibiotics Rash    Other reaction(s): Intolerance    Consultations:  Orthopedics    Procedures/Studies: Dg Chest 1 View  Result Date: 06/11/2019 CLINICAL DATA:  Left hip fracture.  Preoperative exam. EXAM: CHEST  1 VIEW COMPARISON:  None. FINDINGS: Lungs are clear. Heart size is upper normal. No pneumothorax or pleural effusion. Calcified mediastinal lymph nodes noted. No acute or focal bony abnormality. IMPRESSION: No acute disease. Electronically Signed   By: Inge Rise M.D.   On: 06/11/2019 11:06   Dg C-arm 1-60 Min  Result Date: 06/12/2019 CLINICAL DATA:  Left intertrochanteric femur fracture ORIF. EXAM: LEFT FEMUR 2 VIEWS; DG C-ARM 1-60 MIN COMPARISON:  Left hip x-rays from yesterday. FLUOROSCOPY TIME:  1 minutes, 53 seconds. C-arm fluoroscopic images were obtained intraoperatively and submitted for post operative interpretation. FINDINGS: Multiple intraoperative fluoroscopic images demonstrate interval cephalomedullary nail fixation of the left intertrochanteric hip fracture, now in anatomic alignment. IMPRESSION:  Intraoperative fluoroscopic guidance for left intertrochanteric femur fracture ORIF. Electronically Signed   By: Titus Dubin M.D.   On: 06/12/2019 16:12   Dg Hip Unilat With Pelvis 2-3 Views Left  Result Date: 06/11/2019 CLINICAL DATA:  Hip deformity.  Fall. EXAM: DG HIP (WITH OR WITHOUT PELVIS) 2-3V LEFT COMPARISON:  None. FINDINGS: The patient is status post fracture of the proximal left femur extending from the proximal diaphysis into the intertrochanteric region. No dislocation identified. IMPRESSION: Left hip fracture as above. Electronically Signed   By: Dorise Bullion III M.D   On: 06/11/2019 10:17   Dg Femur Min 2 Views Left  Result Date: 06/12/2019 CLINICAL DATA:  Left intertrochanteric femur fracture ORIF. EXAM: LEFT FEMUR 2 VIEWS; DG C-ARM 1-60 MIN COMPARISON:  Left hip x-rays from yesterday. FLUOROSCOPY TIME:  1 minutes, 53 seconds. C-arm fluoroscopic images were obtained intraoperatively and submitted for post operative interpretation. FINDINGS: Multiple intraoperative fluoroscopic images demonstrate interval cephalomedullary nail fixation of the left intertrochanteric hip fracture, now in anatomic alignment. IMPRESSION: Intraoperative fluoroscopic guidance for left intertrochanteric femur fracture ORIF. Electronically Signed   By: Huntley Dec  Derry M.D.   On: 06/12/2019 16:12     06/12/19 - ORIF via intramedullary nail placement  Subjective: Patient seen and examined awake sitting up in bed.  No acute events reported overnight.  She states little to no pain at rest.  She is unsure how much pain with ambulation, cannot recall.  Denies fever/chills, chest pain, SOB or other acute complaints.   Discharge Exam: Vitals:   06/17/19 0511 06/17/19 0716  BP: 138/71 125/72  Pulse: 87 89  Resp: 15 15  Temp: 97.7 F (36.5 C) (!) 97.4 F (36.3 C)  SpO2: 94% 95%   Vitals:   06/16/19 1500 06/16/19 2037 06/17/19 0511 06/17/19 0716  BP: 130/63 (!) 142/85 138/71 125/72  Pulse: 98 94  87 89  Resp: 16 20 15 15   Temp: 98.4 F (36.9 C) 99.1 F (37.3 C) 97.7 F (36.5 C) (!) 97.4 F (36.3 C)  TempSrc: Oral Oral Oral Oral  SpO2: 96% 95% 94% 95%  Weight:   57.7 kg   Height:        General: Pt is alert, awake, not in acute distress Cardiovascular: RRR, S1/S2 +, no rubs, no gallops Respiratory: CTA bilaterally, no wheezing, no rhonchi Abdominal: Soft, NT, ND, bowel sounds + Extremities: no edema, no cyanosis    The results of significant diagnostics from this hospitalization (including imaging, microbiology, ancillary and laboratory) are listed below for reference.     Microbiology: Recent Results (from the past 240 hour(s))  SARS CORONAVIRUS 2 (TAT 6-24 HRS) Nasopharyngeal Nasopharyngeal Swab     Status: None   Collection Time: 06/11/19  1:20 PM   Specimen: Nasopharyngeal Swab  Result Value Ref Range Status   SARS Coronavirus 2 NEGATIVE NEGATIVE Final    Comment: (NOTE) SARS-CoV-2 target nucleic acids are NOT DETECTED. The SARS-CoV-2 RNA is generally detectable in upper and lower respiratory specimens during the acute phase of infection. Negative results do not preclude SARS-CoV-2 infection, do not rule out co-infections with other pathogens, and should not be used as the sole basis for treatment or other patient management decisions. Negative results must be combined with clinical observations, patient history, and epidemiological information. The expected result is Negative. Fact Sheet for Patients: SugarRoll.be Fact Sheet for Healthcare Providers: https://www.woods-mathews.com/ This test is not yet approved or cleared by the [place] FDA and  has been authorized for detection and/or diagnosis of SARS-CoV-2 by FDA under an Emergency Use Authorization (EUA). This EUA will remain  in effect (meaning this test can be used) for the duration of the COVID-19 declaration under Section 56 4(b)(1) of the Act, 21  U.S.C. section 360bbb-3(b)(1), unless the authorization is terminated or revoked sooner. Performed at [facility], [place] [address], [place], [place] [address]   MRSA PCR Screening     Status: None   Collection Time: 06/12/19  2:42 AM   Specimen: Nasal Mucosa; Nasopharyngeal  Result Value Ref Range Status   MRSA by PCR NEGATIVE NEGATIVE Final    Comment:        The GeneXpert MRSA Assay (FDA approved for NASAL specimens only), is one component of a comprehensive MRSA colonization surveillance program. It is not intended to diagnose MRSA infection nor to guide or monitor treatment for MRSA infections. Performed at [facility], [place] [address], [place], [place] [address]   SARS CORONAVIRUS 2 (TAT 6-24 HRS) Nasopharyngeal Nasopharyngeal Swab     Status: None   Collection Time: 06/16/19 12:17 PM   Specimen: Nasopharyngeal Swab  Result Value Ref Range Status   SARS Coronavirus 2 NEGATIVE NEGATIVE Final    Comment: (NOTE) SARS-CoV-2 target nucleic acids are NOT DETECTED. The SARS-CoV-2 RNA is generally detectable in upper and lower respiratory specimens during the acute phase of infection. Negative results do not preclude SARS-CoV-2 infection, do not rule out co-infections with other pathogens, and should not be used as the sole basis for treatment or other patient management decisions. Negative results must be combined with clinical observations, patient history, and epidemiological information. The expected result is Negative. Fact Sheet for Patients: SugarRoll.be Fact Sheet for Healthcare Providers: https://www.woods-mathews.com/ This test is not yet approved or cleared by the [place] FDA and  has been authorized for detection and/or diagnosis of SARS-CoV-2 by FDA under an Emergency Use Authorization (EUA). This EUA will remain  in effect (meaning this test can be used) for the duration of the COVID-19 declaration  under Section 56 4(b)(1) of the Act, 21 U.S.C. section 360bbb-3(b)(1), unless the authorization is terminated or revoked sooner. Performed at [facility], [place] [address], [place], [place] [address]      Labs: BNP (last 3 results) No results for input(s): BNP in the last 8760 hours. Basic Metabolic Panel: Recent Labs  Lab 06/11/19 1014 06/13/19 0410 06/14/19 0336 06/16/19 0600  NA 138 135 136 138  K 3.1* 3.9 3.4* 3.7  CL 107 107 105 106  CO2 22 18* 23 24  GLUCOSE 178* 116* 111* 121*  BUN 10 9 8 14   CREATININE 0.84 0.68 0.73 0.56  CALCIUM 8.7* 7.9* 8.0* 8.4*  MG  --  1.8  --  2.0  PHOS  --   --   --  3.0   Liver Function Tests: Recent Labs  Lab 06/16/19 0600  ALBUMIN 2.5*   No results for input(s): LIPASE, AMYLASE in the last 168 hours. No results for input(s): AMMONIA in the last 168 hours. CBC: Recent Labs  Lab 06/11/19 1014 06/13/19 0410 06/14/19 0336 06/15/19 0314  WBC 12.4* 10.6* 9.0 8.2  NEUTROABS 8.1*  --   --   --   HGB 13.0 10.5* 9.6* 8.9*  HCT 39.5 31.8* 28.6* 26.5*  MCV 95.2 96.1 93.8 94.3  PLT 242 127* 134* 165   Cardiac Enzymes: No results for input(s): CKTOTAL, CKMB, CKMBINDEX, TROPONINI in the last 168 hours. BNP: Invalid input(s): POCBNP CBG: No results for input(s): GLUCAP in the last 168 hours. D-Dimer No results for input(s): DDIMER in the last 72 hours. Hgb A1c No results for input(s): HGBA1C in the last 72 hours. Lipid Profile No results for input(s): CHOL, HDL, LDLCALC, TRIG, CHOLHDL, LDLDIRECT in the last 72 hours. Thyroid function studies No results for input(s): TSH, T4TOTAL, T3FREE, THYROIDAB in the last 72 hours.  Invalid input(s): FREET3 Anemia work up No results for input(s): VITAMINB12, FOLATE, FERRITIN, TIBC, IRON, RETICCTPCT in the last 72 hours. Urinalysis No results found for: COLORURINE, APPEARANCEUR, [place], [place], [place], [place], [place], [place], PROTEINUR, UROBILINOGEN, NITRITE,  LEUKOCYTESUR Sepsis Labs Invalid input(s): PROCALCITONIN,  WBC,  LACTICIDVEN Microbiology Recent Results (from the past 240 hour(s))  SARS CORONAVIRUS 2 (TAT 6-24 HRS) Nasopharyngeal Nasopharyngeal Swab     Status: None   Collection Time: 06/11/19  1:20 PM   Specimen: Nasopharyngeal Swab  Result Value Ref Range Status   SARS Coronavirus 2 NEGATIVE NEGATIVE Final    Comment: (NOTE) SARS-CoV-2 target nucleic acids are NOT DETECTED. The SARS-CoV-2 RNA is generally detectable in upper and lower respiratory specimens during the acute  phase of infection. Negative results do not preclude SARS-CoV-2 infection, do not rule out co-infections with other pathogens, and should not be used as the sole basis for treatment or other patient management decisions. Negative results must be combined with clinical observations, patient history, and epidemiological information. The expected result is Negative. Fact Sheet for Patients: SugarRoll.be Fact Sheet for Healthcare Providers: https://www.woods-mathews.com/ This test is not yet approved or cleared by the [place] FDA and  has been authorized for detection and/or diagnosis of SARS-CoV-2 by FDA under an Emergency Use Authorization (EUA). This EUA will remain  in effect (meaning this test can be used) for the duration of the COVID-19 declaration under Section 56 4(b)(1) of the Act, 21 U.S.C. section 360bbb-3(b)(1), unless the authorization is terminated or revoked sooner. Performed at [facility], [place] [address] [place], [place] [address]   MRSA PCR Screening     Status: None   Collection Time: 06/12/19  2:42 AM   Specimen: Nasal Mucosa; Nasopharyngeal  Result Value Ref Range Status   MRSA by PCR NEGATIVE NEGATIVE Final    Comment:        The GeneXpert MRSA Assay (FDA approved for NASAL specimens only), is one component of a comprehensive MRSA colonization surveillance program. It is  not intended to diagnose MRSA infection nor to guide or monitor treatment for MRSA infections. Performed at [facility], [place] [address], [place], [place] [address]   SARS CORONAVIRUS 2 (TAT 6-24 HRS) Nasopharyngeal Nasopharyngeal Swab     Status: None   Collection Time: 06/16/19 12:17 PM   Specimen: Nasopharyngeal Swab  Result Value Ref Range Status   SARS Coronavirus 2 NEGATIVE NEGATIVE Final    Comment: (NOTE) SARS-CoV-2 target nucleic acids are NOT DETECTED. The SARS-CoV-2 RNA is generally detectable in upper and lower respiratory specimens during the acute phase of infection. Negative results do not preclude SARS-CoV-2 infection, do not rule out co-infections with other pathogens, and should not be used as the sole basis for treatment or other patient management decisions. Negative results must be combined with clinical observations, patient history, and epidemiological information. The expected result is Negative. Fact Sheet for Patients: SugarRoll.be Fact Sheet for Healthcare Providers: https://www.woods-mathews.com/ This test is not yet approved or cleared by the [place] FDA and  has been authorized for detection and/or diagnosis of SARS-CoV-2 by FDA under an Emergency Use Authorization (EUA). This EUA will remain  in effect (meaning this test can be used) for the duration of the COVID-19 declaration under Section 56 4(b)(1) of the Act, 21 U.S.C. section 360bbb-3(b)(1), unless the authorization is terminated or revoked sooner. Performed at [facility], [place] [address] [place], [place] [address]      Time coordinating discharge: Over 30 minutes  SIGNED:   Ezekiel Slocumb, DO [facility] 06/17/2019, 12:44 PM Pager [phone]  If 7PM-7AM, please contact night-coverage [email or web address] Password [email or web address]

## 2019-06-17 NOTE — TOC Transition Note (Signed)
Transition of Care [facility]) - CM/SW Discharge Note   Patient Details  Name: Sheri Payne MRN: [id number] Date of Birth: April 26, 1926  Transition of Care [facility]) CM/SW Contact:  Atilano Median, LCSW Phone Number: 06/17/2019, 1:20 PM   Clinical Narrative:    Patient discharged back to [facility]. Return coordinated with [place] [phone]. Patient's son Legrand Como [phone] aware and agreeable to this plan. Both are aware that the patient requires SNF level of care, but Legrand Como spoke to the facility and they feel they are able to accommodate the patients needs. No other needs at this time. DC summary, COVID test results, and FL2 sent via fax to [phone]. No other needs at this time. Case closed to this CSW.   Final next level of care: Assisted Living Barriers to Discharge: Barriers Resolved   Patient Goals and CMS Choice Patient states their goals for this hospitalization and ongoing recovery are:: [facility]      Discharge Placement                  Name of family member notified: Kourtnie Sachs Patient and family notified of of transfer: 06/17/19  Discharge Plan and Services                          HH Arranged: RN, PT, OT [place] Agency: [facility] [employer] Date [place]: 06/17/19 Time [place]: 1319 Representative spoke with at [place]: [place] ([place]) Interventions     Readmission Risk Interventions No flowsheet data found.

## 2019-07-01 ENCOUNTER — Ambulatory Visit (INDEPENDENT_AMBULATORY_CARE_PROVIDER_SITE_OTHER): Payer: Self-pay | Admitting: Orthopaedic Surgery

## 2019-07-01 ENCOUNTER — Encounter: Payer: Self-pay | Admitting: Orthopaedic Surgery

## 2019-07-01 ENCOUNTER — Other Ambulatory Visit: Payer: Self-pay

## 2019-07-01 ENCOUNTER — Ambulatory Visit (INDEPENDENT_AMBULATORY_CARE_PROVIDER_SITE_OTHER): Payer: Self-pay

## 2019-07-01 DIAGNOSIS — S72142A Displaced intertrochanteric fracture of left femur, initial encounter for closed fracture: Secondary | ICD-10-CM

## 2019-07-01 DIAGNOSIS — M25552 Pain in left hip: Secondary | ICD-10-CM

## 2019-07-01 NOTE — Progress Notes (Signed)
   Post-Op Visit Note   Patient: Sheri Payne           Date of Birth: 07-21-1926           MRN: [id number] Visit Date: 07/01/2019 PCP: System, Pcp Not In   Assessment & Plan:  Chief Complaint:  Chief Complaint  Patient presents with  . Left Hip - Pain, Routine Post Op   Visit Diagnoses:  1. Displaced intertrochanteric fracture of left femur, initial encounter for closed fracture ([place])   2. Pain in left hip     Plan: Patient is 2-week status post left intertrochanteric fracture.  Patient does not give much terms of HPI details due to dementia.  Surgical incisions are healed without any signs of infection.  Staples were removed today.  X-rays demonstrate stable fixation alignment of the fracture.  Today we remove the staples in place Steri-Strips.  She will continue to mobilize with physical therapy.  Recheck in 4 weeks with two-view x-rays of the left hip.  Follow-Up Instructions: Return in about 4 weeks (around 07/29/2019).   Orders:  Orders Placed This Encounter  Procedures  . XR FEMUR MIN 2 VIEWS LEFT   No orders of the defined types were placed in this encounter.   Imaging: Xr Femur Min 2 Views Left  Result Date: 07/01/2019 Stable alignment and fixation of intertrochanteric fracture   PMFS History: Patient Active Problem List   Diagnosis Date Noted  . Displaced intertrochanteric fracture of left femur, initial encounter for closed fracture ([place]) 06/11/2019  . Hip fracture ([place]) 06/11/2019   Past Medical History:  Diagnosis Date  . Glaucoma     History reviewed. No pertinent family history.  Past Surgical History:  Procedure Laterality Date  . APPENDECTOMY    . EYE SURGERY Right    pt unsure what type of eye surgery but states in right eye  . INTRAMEDULLARY (IM) NAIL INTERTROCHANTERIC Left 06/12/2019   Procedure: INTRAMEDULLARY (IM) NAIL INTERTROCHANTRIC;  Surgeon: Leandrew Koyanagi, MD;  Location: [facility];  Service: Orthopedics;  Laterality: Left;   Social  History   Occupational History  . Not on file  Tobacco Use  . Smoking status: Former Smoker    Types: Cigarettes    Quit date: 02/20/1945    Years since quitting: 74.4  . Smokeless tobacco: Never Used  Substance and Sexual Activity  . Alcohol use: Yes    Comment: 1 martini a day most days  . Drug use: No  . Sexual activity: Not on file

## 2019-07-19 ENCOUNTER — Inpatient Hospital Stay (HOSPITAL_COMMUNITY)
Admission: EM | Admit: 2019-07-19 | Discharge: 2019-08-01 | DRG: 951 | Disposition: E | Payer: Medicare Other | Source: Skilled Nursing Facility | Attending: Internal Medicine | Admitting: Internal Medicine

## 2019-07-19 ENCOUNTER — Other Ambulatory Visit: Payer: Self-pay

## 2019-07-19 ENCOUNTER — Emergency Department (HOSPITAL_COMMUNITY): Payer: Medicare Other

## 2019-07-19 ENCOUNTER — Encounter (HOSPITAL_COMMUNITY): Payer: Self-pay | Admitting: Emergency Medicine

## 2019-07-19 DIAGNOSIS — R6521 Severe sepsis with septic shock: Secondary | ICD-10-CM | POA: Diagnosis present

## 2019-07-19 DIAGNOSIS — R64 Cachexia: Secondary | ICD-10-CM | POA: Diagnosis present

## 2019-07-19 DIAGNOSIS — Z7901 Long term (current) use of anticoagulants: Secondary | ICD-10-CM

## 2019-07-19 DIAGNOSIS — Z79899 Other long term (current) drug therapy: Secondary | ICD-10-CM

## 2019-07-19 DIAGNOSIS — I63019 Cerebral infarction due to thrombosis of unspecified vertebral artery: Secondary | ICD-10-CM | POA: Diagnosis not present

## 2019-07-19 DIAGNOSIS — Z87891 Personal history of nicotine dependence: Secondary | ICD-10-CM

## 2019-07-19 DIAGNOSIS — R29713 NIHSS score 13: Secondary | ICD-10-CM | POA: Diagnosis present

## 2019-07-19 DIAGNOSIS — Z79891 Long term (current) use of opiate analgesic: Secondary | ICD-10-CM | POA: Diagnosis not present

## 2019-07-19 DIAGNOSIS — A4189 Other specified sepsis: Secondary | ICD-10-CM | POA: Diagnosis present

## 2019-07-19 DIAGNOSIS — Z66 Do not resuscitate: Secondary | ICD-10-CM | POA: Diagnosis present

## 2019-07-19 DIAGNOSIS — A419 Sepsis, unspecified organism: Secondary | ICD-10-CM | POA: Diagnosis not present

## 2019-07-19 DIAGNOSIS — I639 Cerebral infarction, unspecified: Secondary | ICD-10-CM | POA: Diagnosis present

## 2019-07-19 DIAGNOSIS — I4891 Unspecified atrial fibrillation: Secondary | ICD-10-CM | POA: Diagnosis present

## 2019-07-19 DIAGNOSIS — H409 Unspecified glaucoma: Secondary | ICD-10-CM | POA: Diagnosis present

## 2019-07-19 DIAGNOSIS — J069 Acute upper respiratory infection, unspecified: Secondary | ICD-10-CM

## 2019-07-19 DIAGNOSIS — F0390 Unspecified dementia without behavioral disturbance: Secondary | ICD-10-CM | POA: Diagnosis present

## 2019-07-19 DIAGNOSIS — Z882 Allergy status to sulfonamides status: Secondary | ICD-10-CM | POA: Diagnosis not present

## 2019-07-19 DIAGNOSIS — R0602 Shortness of breath: Secondary | ICD-10-CM

## 2019-07-19 DIAGNOSIS — Z515 Encounter for palliative care: Secondary | ICD-10-CM | POA: Diagnosis present

## 2019-07-19 DIAGNOSIS — Z9889 Other specified postprocedural states: Secondary | ICD-10-CM | POA: Diagnosis not present

## 2019-07-19 DIAGNOSIS — U071 COVID-19: Secondary | ICD-10-CM | POA: Diagnosis present

## 2019-07-19 DIAGNOSIS — E872 Acidosis: Secondary | ICD-10-CM

## 2019-07-19 DIAGNOSIS — Z6821 Body mass index (BMI) 21.0-21.9, adult: Secondary | ICD-10-CM

## 2019-07-19 DIAGNOSIS — I6389 Other cerebral infarction: Secondary | ICD-10-CM | POA: Diagnosis present

## 2019-07-19 DIAGNOSIS — N179 Acute kidney failure, unspecified: Secondary | ICD-10-CM | POA: Diagnosis present

## 2019-07-19 DIAGNOSIS — T68XXXA Hypothermia, initial encounter: Secondary | ICD-10-CM

## 2019-07-19 DIAGNOSIS — J1289 Other viral pneumonia: Secondary | ICD-10-CM | POA: Diagnosis present

## 2019-07-19 DIAGNOSIS — G8194 Hemiplegia, unspecified affecting left nondominant side: Secondary | ICD-10-CM | POA: Diagnosis present

## 2019-07-19 DIAGNOSIS — J9601 Acute respiratory failure with hypoxia: Secondary | ICD-10-CM | POA: Diagnosis present

## 2019-07-19 DIAGNOSIS — R0902 Hypoxemia: Secondary | ICD-10-CM

## 2019-07-19 LAB — CBC WITH DIFFERENTIAL/PLATELET
Abs Immature Granulocytes: 0.56 10*3/uL — ABNORMAL HIGH (ref 0.00–0.07)
Basophils Absolute: 0.1 10*3/uL (ref 0.0–0.1)
Basophils Relative: 1 %
Eosinophils Absolute: 0 10*3/uL (ref 0.0–0.5)
Eosinophils Relative: 0 %
HCT: 51.9 % — ABNORMAL HIGH (ref 36.0–46.0)
Hemoglobin: 16.8 g/dL — ABNORMAL HIGH (ref 12.0–15.0)
Immature Granulocytes: 3 %
Lymphocytes Relative: 8 %
Lymphs Abs: 1.3 10*3/uL (ref 0.7–4.0)
MCH: 31.3 pg (ref 26.0–34.0)
MCHC: 32.4 g/dL (ref 30.0–36.0)
MCV: 96.6 fL (ref 80.0–100.0)
Monocytes Absolute: 0.9 10*3/uL (ref 0.1–1.0)
Monocytes Relative: 5 %
Neutro Abs: 14.6 10*3/uL — ABNORMAL HIGH (ref 1.7–7.7)
Neutrophils Relative %: 83 %
Platelets: 160 10*3/uL (ref 150–400)
RBC: 5.37 MIL/uL — ABNORMAL HIGH (ref 3.87–5.11)
RDW: 14.9 % (ref 11.5–15.5)
WBC: 17.5 10*3/uL — ABNORMAL HIGH (ref 4.0–10.5)
nRBC: 0.1 % (ref 0.0–0.2)

## 2019-07-19 LAB — URINALYSIS, ROUTINE W REFLEX MICROSCOPIC
Bilirubin Urine: NEGATIVE
Glucose, UA: NEGATIVE mg/dL
Ketones, ur: 5 mg/dL — AB
Nitrite: NEGATIVE
Protein, ur: 30 mg/dL — AB
Specific Gravity, Urine: 1.019 (ref 1.005–1.030)
pH: 5 (ref 5.0–8.0)

## 2019-07-19 LAB — LACTIC ACID, PLASMA
Lactic Acid, Venous: 7.1 mmol/L (ref 0.5–1.9)
Lactic Acid, Venous: 3.9 mmol/L (ref 0.5–1.9)

## 2019-07-19 LAB — RAPID URINE DRUG SCREEN, HOSP PERFORMED
Amphetamines: NOT DETECTED
Barbiturates: NOT DETECTED
Benzodiazepines: NOT DETECTED
Cocaine: NOT DETECTED
Opiates: NOT DETECTED
Tetrahydrocannabinol: NOT DETECTED

## 2019-07-19 LAB — COMPREHENSIVE METABOLIC PANEL
ALT: 19 U/L (ref 0–44)
AST: 40 U/L (ref 15–41)
Albumin: 3.3 g/dL — ABNORMAL LOW (ref 3.5–5.0)
Alkaline Phosphatase: 135 U/L — ABNORMAL HIGH (ref 38–126)
Anion gap: 21 — ABNORMAL HIGH (ref 5–15)
BUN: 52 mg/dL — ABNORMAL HIGH (ref 8–23)
CO2: 15 mmol/L — ABNORMAL LOW (ref 22–32)
Calcium: 9.7 mg/dL (ref 8.9–10.3)
Chloride: 105 mmol/L (ref 98–111)
Creatinine, Ser: 1.37 mg/dL — ABNORMAL HIGH (ref 0.44–1.00)
GFR calc Af Amer: 39 mL/min — ABNORMAL LOW (ref >60)
GFR calc non Af Amer: 34 mL/min — ABNORMAL LOW (ref >60)
Glucose, Bld: 288 mg/dL — ABNORMAL HIGH (ref 70–99)
Potassium: 4.6 mmol/L (ref 3.5–5.1)
Sodium: 141 mmol/L (ref 135–145)
Total Bilirubin: 1.3 mg/dL — ABNORMAL HIGH (ref 0.3–1.2)
Total Protein: 8.5 g/dL — ABNORMAL HIGH (ref 6.5–8.1)

## 2019-07-19 LAB — FIBRINOGEN: Fibrinogen: 469 mg/dL (ref 210–475)

## 2019-07-19 LAB — I-STAT CHEM 8, ED
BUN: 48 mg/dL — ABNORMAL HIGH (ref 8–23)
Calcium, Ion: 1.13 mmol/L — ABNORMAL LOW (ref 1.15–1.40)
Chloride: 112 mmol/L — ABNORMAL HIGH (ref 98–111)
Creatinine, Ser: 0.9 mg/dL (ref 0.44–1.00)
Glucose, Bld: 228 mg/dL — ABNORMAL HIGH (ref 70–99)
HCT: 44 % (ref 36.0–46.0)
Hemoglobin: 15 g/dL (ref 12.0–15.0)
Potassium: 3.7 mmol/L (ref 3.5–5.1)
Sodium: 143 mmol/L (ref 135–145)
TCO2: 19 mmol/L — ABNORMAL LOW (ref 22–32)

## 2019-07-19 LAB — LACTATE DEHYDROGENASE: LDH: 379 U/L — ABNORMAL HIGH (ref 98–192)

## 2019-07-19 LAB — D-DIMER, QUANTITATIVE: D-Dimer, Quant: 15.35 ug/mL-FEU — ABNORMAL HIGH (ref 0.00–0.50)

## 2019-07-19 LAB — C-REACTIVE PROTEIN: CRP: 12.6 mg/dL — ABNORMAL HIGH (ref <1.0)

## 2019-07-19 LAB — APTT: aPTT: 30 seconds (ref 24–36)

## 2019-07-19 LAB — SARS CORONAVIRUS 2 (TAT 6-24 HRS): SARS Coronavirus 2: POSITIVE — AB

## 2019-07-19 LAB — TRIGLYCERIDES: Triglycerides: 212 mg/dL — ABNORMAL HIGH (ref <150)

## 2019-07-19 LAB — ETHANOL: Alcohol, Ethyl (B): <10 mg/dL (ref <10)

## 2019-07-19 LAB — PROCALCITONIN: Procalcitonin: 0.13 ng/mL

## 2019-07-19 LAB — FERRITIN: Ferritin: 858 ng/mL — ABNORMAL HIGH (ref 11–307)

## 2019-07-19 LAB — PROTIME-INR
INR: 1.2 (ref 0.8–1.2)
Prothrombin Time: 14.7 seconds (ref 11.4–15.2)

## 2019-07-19 LAB — BRAIN NATRIURETIC PEPTIDE: B Natriuretic Peptide: 119.2 pg/mL — ABNORMAL HIGH (ref 0.0–100.0)

## 2019-07-19 MED ORDER — SODIUM CHLORIDE 0.9 % IV SOLN: INTRAVENOUS | Status: DC

## 2019-07-19 MED ORDER — IOHEXOL 350 MG/ML SOLN
75.0000 mL | Freq: Once | INTRAVENOUS | Status: AC | PRN
  Administered 2019-07-19: 17:00:00 75 mL via INTRAVENOUS

## 2019-07-19 MED ORDER — METRONIDAZOLE IN NACL 5-0.79 MG/ML-% IV SOLN
500.0000 mg | Freq: Once | INTRAVENOUS | Status: AC
  Administered 2019-07-19: 13:00:00 500 mg via INTRAVENOUS
  Filled 2019-07-19: qty 100

## 2019-07-19 MED ORDER — SODIUM CHLORIDE 0.9 % IV BOLUS
500.0000 mL | Freq: Once | INTRAVENOUS | Status: AC
  Administered 2019-07-19: 12:00:00 500 mL via INTRAVENOUS

## 2019-07-19 MED ORDER — MORPHINE SULFATE (PF) 2 MG/ML IV SOLN
2.0000 mg | Freq: Once | INTRAVENOUS | Status: AC
  Administered 2019-07-19: 18:00:00 2 mg via INTRAVENOUS
  Filled 2019-07-19: qty 1

## 2019-07-19 MED ORDER — ONDANSETRON HCL 4 MG/2ML IJ SOLN: 4.0000 mg | Freq: Four times a day (QID) | INTRAMUSCULAR | Status: DC | PRN

## 2019-07-19 MED ORDER — ONDANSETRON 4 MG PO TBDP: 4.0000 mg | ORAL_TABLET | Freq: Four times a day (QID) | ORAL | Status: DC | PRN

## 2019-07-19 MED ORDER — LORAZEPAM 2 MG/ML IJ SOLN: 1.0000 mg | INTRAMUSCULAR | Status: DC | PRN

## 2019-07-19 MED ORDER — POLYVINYL ALCOHOL 1.4 % OP SOLN
1.0000 [drp] | Freq: Four times a day (QID) | OPHTHALMIC | Status: DC | PRN
  Filled 2019-07-19: qty 15

## 2019-07-19 MED ORDER — SODIUM CHLORIDE 0.9 % IV BOLUS
1000.0000 mL | Freq: Once | INTRAVENOUS | Status: AC
  Administered 2019-07-19: 15:00:00 1000 mL via INTRAVENOUS

## 2019-07-19 MED ORDER — GLYCOPYRROLATE 0.2 MG/ML IJ SOLN: 0.2000 mg | INTRAMUSCULAR | Status: DC | PRN

## 2019-07-19 MED ORDER — SODIUM CHLORIDE 0.9 % IV SOLN: 2.0000 g | INTRAVENOUS | Status: DC

## 2019-07-19 MED ORDER — HALOPERIDOL 0.5 MG PO TABS
0.5000 mg | ORAL_TABLET | ORAL | Status: DC | PRN
  Filled 2019-07-19: qty 1

## 2019-07-19 MED ORDER — SODIUM CHLORIDE 0.9 % IV SOLN
2.0000 g | Freq: Once | INTRAVENOUS | Status: AC
  Administered 2019-07-19: 13:00:00 2 g via INTRAVENOUS
  Filled 2019-07-19: qty 2

## 2019-07-19 MED ORDER — VANCOMYCIN HCL IN DEXTROSE 1-5 GM/200ML-% IV SOLN: 1000.0000 mg | Freq: Once | INTRAVENOUS | Status: DC

## 2019-07-19 MED ORDER — ACETAMINOPHEN 650 MG RE SUPP: 650.0000 mg | Freq: Four times a day (QID) | RECTAL | Status: DC | PRN

## 2019-07-19 MED ORDER — LORAZEPAM 2 MG/ML PO CONC: 1.0000 mg | ORAL | Status: DC | PRN

## 2019-07-19 MED ORDER — MORPHINE 100MG IN NS 100ML (1MG/ML) PREMIX INFUSION
5.0000 mg/h | INTRAVENOUS | Status: DC
  Administered 2019-07-19 – 2019-07-21 (×3): 5 mg/h via INTRAVENOUS
  Filled 2019-07-19 (×4): qty 100

## 2019-07-19 MED ORDER — HALOPERIDOL LACTATE 5 MG/ML IJ SOLN: 0.5000 mg | INTRAMUSCULAR | Status: DC | PRN

## 2019-07-19 MED ORDER — HALOPERIDOL LACTATE 2 MG/ML PO CONC
0.5000 mg | ORAL | Status: DC | PRN
  Filled 2019-07-19: qty 0.3

## 2019-07-19 MED ORDER — GLYCOPYRROLATE 1 MG PO TABS
1.0000 mg | ORAL_TABLET | ORAL | Status: DC | PRN
  Filled 2019-07-19: qty 1

## 2019-07-19 MED ORDER — SODIUM CHLORIDE 0.9 % IV BOLUS
500.0000 mL | Freq: Once | INTRAVENOUS | Status: AC
  Administered 2019-07-19: 13:00:00 500 mL via INTRAVENOUS

## 2019-07-19 MED ORDER — MORPHINE BOLUS VIA INFUSION
1.0000 mg | INTRAVENOUS | Status: DC | PRN
  Filled 2019-07-19: qty 1

## 2019-07-19 MED ORDER — ACETAMINOPHEN 325 MG PO TABS: 650.0000 mg | ORAL_TABLET | Freq: Four times a day (QID) | ORAL | Status: DC | PRN

## 2019-07-19 MED ORDER — VANCOMYCIN HCL IN DEXTROSE 1-5 GM/200ML-% IV SOLN: 1000.0000 mg | INTRAVENOUS | Status: DC

## 2019-07-19 MED ORDER — BIOTENE DRY MOUTH MT LIQD: 15.0000 mL | OROMUCOSAL | Status: DC | PRN

## 2019-07-19 MED ORDER — VANCOMYCIN HCL 1250 MG/250ML IV SOLN
1250.0000 mg | Freq: Once | INTRAVENOUS | Status: AC
  Administered 2019-07-19: 14:00:00 1250 mg via INTRAVENOUS
  Filled 2019-07-19: qty 250

## 2019-07-19 MED ORDER — LORAZEPAM 1 MG PO TABS: 1.0000 mg | ORAL_TABLET | ORAL | Status: DC | PRN

## 2019-07-19 NOTE — ED Provider Notes (Addendum)
[facility] EMERGENCY DEPARTMENT Provider Note   CSN: [id number] Arrival date & time: 07/06/2019  1109     History Chief Complaint  Patient presents with  . Covid/SOB    Sheri Payne is a 85 y.o. female.  Patient brought in from [facility].  Patient last in the hospital November 11 through November 17 when she had an injury to her left hip which was repaired.  Patient found by staff this morning to be altered and having respiratory distress.  Apparently room air sats were in the upper 50s.  And arrived in significant respiratory distress.  Not able to communicate.  Also noted that was not using her left arm or left leg.  But was moving her right arm and right leg.        Past Medical History:  Diagnosis Date  . Glaucoma     Patient Active Problem List   Diagnosis Date Noted  . Displaced intertrochanteric fracture of left femur, initial encounter for closed fracture ([place]) 06/11/2019  . Hip fracture ([place]) 06/11/2019    Past Surgical History:  Procedure Laterality Date  . APPENDECTOMY    . EYE SURGERY Right    pt unsure what type of eye surgery but states in right eye  . INTRAMEDULLARY (IM) NAIL INTERTROCHANTERIC Left 06/12/2019   Procedure: INTRAMEDULLARY (IM) NAIL INTERTROCHANTRIC;  Surgeon: Leandrew Koyanagi, MD;  Location: [facility];  Service: Orthopedics;  Laterality: Left;     OB History   No obstetric history on file.     No family history on file.  Social History   Tobacco Use  . Smoking status: Former Smoker    Types: Cigarettes    Quit date: 02/20/1945    Years since quitting: 74.4  . Smokeless tobacco: Never Used  Substance Use Topics  . Alcohol use: Yes    Comment: 1 martini a day most days  . Drug use: No    Home Medications Prior to Admission medications   Medication Sig Start Date End Date Taking? Authorizing Provider  Brinzolamide-Brimonidine [facility]) 1-0.2 % SUSP Place 1 drop into the right eye 2 (two)  times daily.   Yes [provider]  cholecalciferol (VITAMIN D3) 25 MCG (1000 UT) tablet Take 2,000 Units by mouth daily. For 21 days. Started on 07-15-19.   Yes [provider]  folic acid (FOLVITE) 1 MG tablet Take 1 mg by mouth daily. For 21 days. Started on 07-15-19   Yes [provider]  latanoprost (XALATAN) 0.005 % ophthalmic solution Place 1 drop into both eyes at bedtime.   Yes [provider]  montelukast (SINGULAIR) 10 MG tablet Take 10 mg by mouth at bedtime.   Yes [provider]  Multiple Vitamins-Minerals (CENTRUM SILVER 50+WOMEN PO) Take 1 tablet by mouth daily.   Yes [provider]  Zinc 100 MG TABS Take 100 mg by mouth daily. For 21 days.   Yes [provider]  acetaminophen (TYLENOL) 500 MG tablet Take 500 mg by mouth 3 (three) times daily as needed. For 21 days. Started on 07-15-19    [provider]  albuterol (VENTOLIN HFA) 108 (90 Base) MCG/ACT inhaler Inhale 2 puffs into the lungs 3 (three) times daily as needed for wheezing.    [provider]  apixaban (ELIQUIS) 2.5 MG TABS tablet Take 1 tablet (2.5 mg total) by mouth 2 (two) times daily for 14 days. 06/17/19 07/01/19  Nicole Kindred A, DO  guaifenesin (ROBITUSSIN) 100 MG/5ML  syrup Take 200 mg by mouth 3 (three) times daily as needed for cough.    [provider]  HYDROcodone-acetaminophen (NORCO) 7.5-325 MG tablet Take 1-2 tablets by mouth 3 (three) times daily as needed for moderate pain. 06/12/19   Tarry KosXu, Naiping M, MD  polyethylene glycol (MIRALAX / GLYCOLAX) 17 g packet Take 17 g by mouth daily as needed for mild constipation.    [provider]    Allergies    Sulfa antibiotics  Review of Systems   Review of Systems  Unable to perform ROS: Mental status change    Physical Exam Updated Vital Signs BP 126/82   Pulse (!) 138   Temp (!) 95.7 F (35.4 C) (Rectal)   Resp 16   SpO2 100%   Physical Exam Vitals and  nursing note reviewed.  Constitutional:      General: She is in acute distress.     Appearance: She is well-developed. She is toxic-appearing.  HENT:     Head: Normocephalic and atraumatic.     Mouth/Throat:     Mouth: Mucous membranes are dry.  Eyes:     Conjunctiva/sclera: Conjunctivae normal.  Cardiovascular:     Rate and Rhythm: Regular rhythm. Tachycardia present.     Heart sounds: No murmur.  Pulmonary:     Effort: Respiratory distress present.     Breath sounds: Normal breath sounds.  Abdominal:     Palpations: Abdomen is soft.     Tenderness: There is no abdominal tenderness.  Musculoskeletal:     Comments: Well-healed left lateral incision up by the hip.  No signs of infection.  Skin:    General: Skin is warm and dry.  Neurological:     Comments: Patient with no use of the left upper extremity or left lower extremity.  Will wiggle toes and fingers on the right.  To command.     ED Results / Procedures / Treatments   Labs (all labs ordered are listed, but only abnormal results are displayed) Labs Reviewed  LACTIC ACID, PLASMA - Abnormal; Notable for the following components:      Result Value   Lactic Acid, Venous 7.1 (*)    All other components within normal limits  LACTIC ACID, PLASMA - Abnormal; Notable for the following components:   Lactic Acid, Venous 3.9 (*)    All other components within normal limits  CBC WITH DIFFERENTIAL/PLATELET - Abnormal; Notable for the following components:   WBC 17.5 (*)    RBC 5.37 (*)    Hemoglobin 16.8 (*)    HCT 51.9 (*)    Neutro Abs 14.6 (*)    Abs Immature Granulocytes 0.56 (*)    All other components within normal limits  COMPREHENSIVE METABOLIC PANEL - Abnormal; Notable for the following components:   CO2 15 (*)    Glucose, Bld 288 (*)    BUN 52 (*)    Creatinine, Ser 1.37 (*)    Total Protein 8.5 (*)    Albumin 3.3 (*)    Alkaline Phosphatase 135 (*)    Total Bilirubin 1.3 (*)    GFR calc non Af Amer 34 (*)      GFR calc Af Amer 39 (*)    Anion gap 21 (*)    All other components within normal limits  D-DIMER, QUANTITATIVE (NOT AT [facility]) - Abnormal; Notable for the following components:   D-Dimer, Quant 15.35 (*)    All other components within normal limits  LACTATE DEHYDROGENASE - Abnormal; Notable  for the following components:   LDH 379 (*)    All other components within normal limits  FERRITIN - Abnormal; Notable for the following components:   Ferritin 858 (*)    All other components within normal limits  C-REACTIVE PROTEIN - Abnormal; Notable for the following components:   CRP 12.6 (*)    All other components within normal limits  BRAIN NATRIURETIC PEPTIDE - Abnormal; Notable for the following components:   B Natriuretic Peptide 119.2 (*)    All other components within normal limits  TRIGLYCERIDES - Abnormal; Notable for the following components:   Triglycerides 212 (*)    All other components within normal limits  URINALYSIS, ROUTINE W REFLEX MICROSCOPIC - Abnormal; Notable for the following components:   Color, Urine AMBER (*)    APPearance TURBID (*)    Hgb urine dipstick MODERATE (*)    Ketones, ur 5 (*)    Protein, ur 30 (*)    Leukocytes,Ua TRACE (*)    Bacteria, UA MANY (*)    All other components within normal limits  I-STAT CHEM 8, ED - Abnormal; Notable for the following components:   Chloride 112 (*)    BUN 48 (*)    Glucose, Bld 228 (*)    Calcium, Ion 1.13 (*)    TCO2 19 (*)    All other components within normal limits  SARS CORONAVIRUS 2 (TAT 6-24 HRS)  CULTURE, BLOOD (ROUTINE X 2)  CULTURE, BLOOD (ROUTINE X 2)  URINE CULTURE  PROCALCITONIN  FIBRINOGEN  ETHANOL  PROTIME-INR  APTT  RAPID URINE DRUG SCREEN, HOSP PERFORMED    EKG EKG Interpretation  Date/Time:  Saturday July 19 2019 11:11:55 EST Ventricular Rate:  128 PR Interval:    QRS Duration: 98 QT Interval:  331 QTC Calculation: 474 R Axis:   -81 Text Interpretation: Sinus tachycardia  Paired ventricular premature complexes LAE, consider biatrial enlargement Inferior infarct, old Lateral leads are also involved Confirmed by Vanetta Mulders [phone]) on 07/11/2019 12:06:08 PM   Radiology CT Head Wo Contrast  Result Date: 07/09/2019 CLINICAL DATA:  Neuro deficit question of acute stroke EXAM: CT HEAD WITHOUT CONTRAST TECHNIQUE: Contiguous axial images were obtained from the base of the skull through the vertex without intravenous contrast. COMPARISON:  February 20, 2017 FINDINGS: Brain: No evidence of acute territorial infarction, hemorrhage, hydrocephalus,extra-axial collection or mass lesion/mass effect. There is dilatation the ventricles and sulci consistent with age-related atrophy. Low-attenuation changes in the deep white matter consistent with small vessel ischemia. There is area of encephalomalacia involving the right occipital lobe as on prior exam. There is area of hypodensity involving the anterior right frontotemporal lobe which is new since the prior exam. Vascular: No hyperdense vessel or unexpected calcification. Skull: The skull is intact. No fracture or focal lesion identified. Sinuses/Orbits: The visualized paranasal sinuses and mastoid air cells are clear. The orbits and globes intact. Other: None IMPRESSION: Acute/subacute infarct involving the right frontotemporal lobe. Findings consistent with age related atrophy and chronic small vessel ischemia Prior area of encephalomalacia involving the right occipital lobe. These results were called by telephone at the time of interpretation on 07/14/2019 at 3:17 pm to provider Vanetta Mulders , who verbally acknowledged these results. Electronically Signed   By: Jonna Clark M.D.   On: 07/31/2019 15:21   DG Chest Port 1 View  Result Date: 07/21/2019 CLINICAL DATA:  Shortness of breath, COVID-19 positive EXAM: PORTABLE CHEST 1 VIEW COMPARISON:  06/11/2019 FINDINGS: Heart size is normal. Tortuous thoracic  aorta with atherosclerotic  calcification. No focal airspace consolidation, pleural effusion, or pneumothorax. Osseous structures are demineralized. Degenerative changes of the spine and shoulders. IMPRESSION: No acute cardiopulmonary findings. Electronically Signed   By: Duanne Guess M.D.   On: 07/01/2019 12:28    Procedures Procedures (including critical care time)  CRITICAL CARE Performed by: Vanetta Mulders Total critical care time: 60 minutes Critical care time was exclusive of separately billable procedures and treating other patients. Critical care was necessary to treat or prevent imminent or life-threatening deterioration. Critical care was time spent personally by me on the following activities: development of treatment plan with patient and/or surrogate as well as nursing, discussions with consultants, evaluation of patient's response to treatment, examination of patient, obtaining history from patient or surrogate, ordering and performing treatments and interventions, ordering and review of laboratory studies, ordering and review of radiographic studies, pulse oximetry and re-evaluation of patient's condition.   Medications Ordered in ED Medications  0.9 %  sodium chloride infusion ( Intravenous New Bag/Given 07/21/2019 1217)  ceFEPIme (MAXIPIME) 2 g in sodium chloride 0.9 % 100 mL IVPB (has no administration in time range)  vancomycin (VANCOCIN) IVPB 1000 mg/200 mL premix (has no administration in time range)  sodium chloride 0.9 % bolus 500 mL (0 mLs Intravenous Stopped 07/15/2019 1428)  ceFEPIme (MAXIPIME) 2 g in sodium chloride 0.9 % 100 mL IVPB (0 g Intravenous Stopped 07/06/2019 1400)  metroNIDAZOLE (FLAGYL) IVPB 500 mg (0 mg Intravenous Stopped 07/28/2019 1427)  vancomycin (VANCOREADY) IVPB 1250 mg/250 mL (1,250 mg Intravenous New Bag/Given 07/18/2019 1401)  sodium chloride 0.9 % bolus 500 mL (0 mLs Intravenous Stopped 07/03/2019 1428)  sodium chloride 0.9 % bolus 1,000 mL (1,000 mLs Intravenous New Bag/Given  07/20/2019 1439)    ED Course  I have reviewed the triage vital signs and the nursing notes.  Pertinent labs & imaging results that were available during my care of the patient were reviewed by me and considered in my medical decision making (see chart for details).    MDM Rules/Calculators/A&P                     Initially discussed with hospitalist.  Due to the severity and the fact that she is a full code recommended discussing with critical care.  Critical care will see the patient.  In addition to what we have done they also concerned about possible pulmonary embolus.  Patient's renal function GFR is currently below 40.  But have ordered.  Perhaps with the fluids it will improve because her BUN and creatinine were very suggestive of prerenal situation.  Patient never hypotensive but still remains tachycardic.  Chest x-ray was not very impressive expect that it did look like a full-blown Covid infection.  Based on this patient was sort of treated as a potential code sepsis initially got 500 cc of fluid and another 500 and then is now receiving her liter fluid which brings her up to 2 L which is her sepsis dose of fluids.  Particularly with the BUN and creatinine being the way it is.  Patient started on broad-spectrum antibiotics.  Patient remained on 100% nonrebreather but now satting in the upper upper 90s to 100%.    CT head results just came back consistent with a right frontal parietal stroke.  Which would explain the neuro deficits.  We have recently seen her start using her left upper extremity some.  But not using her left lower extremity.  Patient combination stroke  Covid infection and sepsis.  And possible pulmonary embolus.  Explaining the hypoxia.  Critical care will admit the patient.   Patient is a full code.  Final Clinical Impression(s) / ED Diagnoses Final diagnoses:  SOB (shortness of breath)  Hypoxia  COVID-19 virus infection  Sepsis, due to unspecified organism,  unspecified whether acute organ dysfunction present (HCC)  Hypothermia, initial encounter  Cerebrovascular accident (CVA), unspecified mechanism [facility])    Rx / DC Orders ED Discharge Orders    None       Vanetta Mulders, MD 07/08/2019 1528    Vanetta Mulders, MD 07/09/2019 1552

## 2019-07-19 NOTE — H&P (Signed)
History and Physical    Mercadies Co IHK:[id number] DOB: 1926/03/17 DOA: 07/09/2019  PCP: System, Pcp Not In Consultants:  Roda Shutters - orthopedics Patient coming from:  [facility]; NOK: Emeterio Reeve, [phone]  Chief Complaint: Worsening COVID-19 symptoms  HPI: Sheri Payne is a 85 y.o. female with medical history significant of mild to moderate dementia who was admitted from 11/11-17 with L hip fracture s/p ORIF, now presenting with worsening COVID symptoms.  Patient was in septic shock on presentation with lactate >7 and noted to be full code.  As such, I requested PCCM admission.  Karin Lieu, NP, just called me to notify me that she has spoken with the family (including physician daughter) and they prefer comfort measures at this time.  The patient is unable to provide history and appears to follow commands and nod her head appropriately to indicate she does not have pain.  I spoke with her son, who reported that he is the POA and has only one request - that we do not delay palliative care measures for any reason, including waiting on family to arrive.      ED Course: Now with COVID x 1 week, this AM with AMS and respiratory distress with sats in 50s.  On NRB, now 100%.  CXR not overly impressive.  Would not move left side on presentation but now using her L arm a bit but not L leg.  Hypothermic, tachycardia without hypotension.  Sepsis, ?code stroke.  Lactate was 7 on presentation, given 1L IVF.  +AKI, completed complete bolus.    Review of Systems: Unable to perform   Past Medical History:  Diagnosis Date  . Glaucoma     Past Surgical History:  Procedure Laterality Date  . APPENDECTOMY    . EYE SURGERY Right    pt unsure what type of eye surgery but states in right eye  . INTRAMEDULLARY (IM) NAIL INTERTROCHANTERIC Left 06/12/2019   Procedure: INTRAMEDULLARY (IM) NAIL INTERTROCHANTRIC;  Surgeon: Tarry Kos, MD;  Location: [facility] OR;  Service: Orthopedics;  Laterality:  Left;    Social History   Socioeconomic History  . Marital status: Widowed    Spouse name: Not on file  . Number of children: Not on file  . Years of education: Not on file  . Highest education level: Not on file  Occupational History  . Not on file  Tobacco Use  . Smoking status: Former Smoker    Types: Cigarettes    Quit date: 02/20/1945    Years since quitting: 74.4  . Smokeless tobacco: Never Used  Substance and Sexual Activity  . Alcohol use: Yes    Comment: 1 martini a day most days  . Drug use: No  . Sexual activity: Not on file  Other Topics Concern  . Not on file  Social History Narrative  . Not on file   Social Determinants of Health   Financial Resource Strain:   . Difficulty of Paying Living Expenses: Not on file  Food Insecurity:   . Worried About Programme researcher, broadcasting/film/video in the Last Year: Not on file  . Ran Out of Food in the Last Year: Not on file  Transportation Needs:   . Lack of Transportation (Medical): Not on file  . Lack of Transportation (Non-Medical): Not on file  Physical Activity:   . Days of Exercise per Week: Not on file  . Minutes of Exercise per Session: Not on file  Stress:   . Feeling of Stress :  Not on file  Social Connections:   . Frequency of Communication with Friends and Family: Not on file  . Frequency of Social Gatherings with Friends and Family: Not on file  . Attends Religious Services: Not on file  . Active Member of Clubs or Organizations: Not on file  . Attends BankerClub or Organization Meetings: Not on file  . Marital Status: Not on file  Intimate Partner Violence:   . Fear of Current or Ex-Partner: Not on file  . Emotionally Abused: Not on file  . Physically Abused: Not on file  . Sexually Abused: Not on file    Allergies  Allergen Reactions  . Sulfa Antibiotics Rash    Other reaction(s): Intolerance    No family history on file.  Prior to Admission medications   Medication Sig Start Date End Date Taking? Authorizing  Provider  Brinzolamide-Brimonidine [facility](SIMBRINZA) 1-0.2 % SUSP Place 1 drop into the right eye 2 (two) times daily.   Yes [provider]  cholecalciferol (VITAMIN D3) 25 MCG (1000 UT) tablet Take 2,000 Units by mouth daily. For 21 days. Started on 07-15-19.   Yes [provider]  folic acid (FOLVITE) 1 MG tablet Take 1 mg by mouth daily. For 21 days. Started on 07-15-19   Yes [provider]  latanoprost (XALATAN) 0.005 % ophthalmic solution Place 1 drop into both eyes at bedtime.   Yes [provider]  montelukast (SINGULAIR) 10 MG tablet Take 10 mg by mouth at bedtime.   Yes [provider]  Multiple Vitamins-Minerals (CENTRUM SILVER 50+WOMEN PO) Take 1 tablet by mouth daily.   Yes [provider]  Zinc 100 MG TABS Take 100 mg by mouth daily. For 21 days.   Yes [provider]  acetaminophen (TYLENOL) 500 MG tablet Take 500 mg by mouth 3 (three) times daily as needed. For 21 days. Started on 07-15-19    [provider]  albuterol (VENTOLIN HFA) 108 (90 Base) MCG/ACT inhaler Inhale 2 puffs into the lungs 3 (three) times daily as needed for wheezing.    [provider]  apixaban (ELIQUIS) 2.5 MG TABS tablet Take 1 tablet (2.5 mg total) by mouth 2 (two) times daily for 14 days. 06/17/19 07/01/19  Pennie BanterGriffith, Kelly A, DO  guaifenesin (ROBITUSSIN) 100 MG/5ML syrup Take 200 mg by mouth 3 (three) times daily as needed for cough.    [provider]  HYDROcodone-acetaminophen (NORCO) 7.5-325 MG tablet Take 1-2 tablets by mouth 3 (three) times daily as needed for moderate pain. 06/12/19   Tarry KosXu, Naiping M, MD  polyethylene glycol (MIRALAX / GLYCOLAX) 17 g packet Take 17 g by mouth daily as needed for mild constipation.    [provider]    Physical Exam: Vitals:   07/06/2019 1415 07/13/2019 1416 07/21/2019 1430 07/02/2019 1600  BP:   126/82 107/82  Pulse: (!) 138  (!) 137 (!) 111  Resp: (!) 23  16 (!) 26  Temp:  (!)  95.7 F (35.4 C)    TempSrc:  Rectal    SpO2: 100%  100% 99%     . General:  Appears calm and comfortable and is NAD; mildly anxious, with NRB in place and Bair hugger on patient . Eyes:  PERRL, EOMI, normal lids, iris . ENT:  grossly normal hearing, lips & tongue, moderately dry mm . Neck:  no LAD, masses or thyromegaly . Cardiovascular:  RR with tachycardia to 130s, no m/r/g. 1+ LE edema.  Marland Kitchen. Respiratory:   CTA  bilaterally with no wheezes/rales/rhonchi.  Normal respiratory effort. . Abdomen:  soft, NT, ND, NABS . Skin:  no rash or induration seen on limited exam . Musculoskeletal: LUE with 3/5 strength; LLE with inability to passively move . Psychiatric:  Mildly anxious mood and affect, speech incomprehensible and minimal  . Neurologic:  Unable to perform, appears to have right-sided neglect    Radiological Exams on Admission: CT Head Wo Contrast  Result Date: 07/29/2019 CLINICAL DATA:  Neuro deficit question of acute stroke EXAM: CT HEAD WITHOUT CONTRAST TECHNIQUE: Contiguous axial images were obtained from the base of the skull through the vertex without intravenous contrast. COMPARISON:  February 20, 2017 FINDINGS: Brain: No evidence of acute territorial infarction, hemorrhage, hydrocephalus,extra-axial collection or mass lesion/mass effect. There is dilatation the ventricles and sulci consistent with age-related atrophy. Low-attenuation changes in the deep white matter consistent with small vessel ischemia. There is area of encephalomalacia involving the right occipital lobe as on prior exam. There is area of hypodensity involving the anterior right frontotemporal lobe which is new since the prior exam. Vascular: No hyperdense vessel or unexpected calcification. Skull: The skull is intact. No fracture or focal lesion identified. Sinuses/Orbits: The visualized paranasal sinuses and mastoid air cells are clear. The orbits and globes intact. Other: None IMPRESSION: Acute/subacute infarct  involving the right frontotemporal lobe. Findings consistent with age related atrophy and chronic small vessel ischemia Prior area of encephalomalacia involving the right occipital lobe. These results were called by telephone at the time of interpretation on 07/06/2019 at 3:17 pm to provider Vanetta Mulders , who verbally acknowledged these results. Electronically Signed   By: Jonna Clark M.D.   On: 07/27/2019 15:21   CT Angio Chest PE W/Cm &/Or Wo Cm  Result Date: 07/03/2019 CLINICAL DATA:  Shortness of breath. Recently diagnosed with COVID-19 infection. EXAM: CT ANGIOGRAPHY CHEST WITH CONTRAST TECHNIQUE: Multidetector CT imaging of the chest was performed using the standard protocol during bolus administration of intravenous contrast. Multiplanar CT image reconstructions and MIPs were obtained to evaluate the vascular anatomy. CONTRAST:  75mL OMNIPAQUE IOHEXOL 350 MG/ML SOLN COMPARISON:  Radiographs today and 06/11/2019. FINDINGS: Cardiovascular: The pulmonary arteries are well opacified with contrast to the level of the subsegmental branches. There is no evidence of acute pulmonary embolism. There is limited opacification of the systemic arteries. Mild aortic atherosclerosis noted. The heart size is normal. There is no pericardial effusion. Mediastinum/Nodes: There are no enlarged mediastinal, hilar or axillary lymph nodes.There are calcified subcarinal and hilar lymph nodes bilaterally. The thyroid gland, trachea and esophagus demonstrate no significant findings. Lungs/Pleura: No pleural effusion or pneumothorax. There is a calcified granuloma at the left lung base. Nonspecific dependent airspace opacities are present at both lung bases, right greater than left. In addition, there are scattered peripheral nodular ground-glass opacities in both lungs, typical of COVID-19 infection. Upper abdomen: There is a large fatty mass in the left upper retroperitoneum, appearing to arise from the left adrenal gland.  This measures 7.3 x 5.9 cm on image 86/5, likely a left adrenal myelolipoma. There are nonobstructing bilateral renal calculi. Multiple small calcified granulomas are present in the liver and spleen. Musculoskeletal/Chest wall: Multiple age-indeterminate compression deformities in the thoracolumbar spine. Fractures at T12 and L1 are grossly unchanged from lumbar spine radiographs 02/20/2017. A T10 fracture has progressed since the toes radiographs there are mild compression deformities at T1 and T2 as well. Multiple left-sided rib fractures. Review of the MIP images confirms the above findings. IMPRESSION: 1. No  evidence of acute pulmonary embolism or other acute vascular process. 2. Patchy nodular ground-glass opacities in both lungs typical of COVID-19 infection. Additional nonspecific opacities at both lung bases. 3. Sequela of prior granulomatous disease with calcified mediastinal and hilar lymph nodes, splenic and hepatic granulomas. 4. Large left adrenal myelolipoma. 5. Multiple age-indeterminate compression deformities in the thoracolumbar spine. The T10 fracture has progressed since the lumbar spine radiographs 02/20/2017. Multiple old left-sided rib fractures. Electronically Signed   By: Richardean Sale M.D.   On: 2019/08/09 17:29   DG Chest Port 1 View  Result Date: August 09, 2019 CLINICAL DATA:  Shortness of breath, COVID-19 positive EXAM: PORTABLE CHEST 1 VIEW COMPARISON:  06/11/2019 FINDINGS: Heart size is normal. Tortuous thoracic aorta with atherosclerotic calcification. No focal airspace consolidation, pleural effusion, or pneumothorax. Osseous structures are demineralized. Degenerative changes of the spine and shoulders. IMPRESSION: No acute cardiopulmonary findings. Electronically Signed   By: Davina Poke M.D.   On: 08-09-2019 12:28    EKG: Independently reviewed.  Sinus tachcyardia with rate 128; nonspecific ST changes   Labs on Admission: I have personally reviewed the available labs  and imaging studies at the time of the admission.  Pertinent labs:   Glucose 288 BUN 52/Creatinine 1.37/GFR 34 LDH 379 Ferritin 858 CRP 12.6 Lactate 7.1, 3.9 Procalcitonin 0.13 WBC 17.5 D-dimer 15.35 Fibrinogen 469 UA: moderate Hgb, 5 ketones, trace LE, 30 protein, many bacteria UDS negative INR 1.2 Blood and urine cultures pending   Assessment/Plan Principal Problem:   Admission for end of life care Active Problems:   CVA (cerebral vascular accident) ([place])   Septic shock due to undetermined organism ([place])   Acute respiratory disease due to COVID-19 virus   Dementia without behavioral disturbance ([place])   -Patient presenting with septic shock, likely associated with COVID-19 infection, requiring NRB O2 -Also found to have L hemiparesis with CT confirming acute/subacute CVA -After discussion with PCCM, family has decided to proceed with comfort care only -I have confirmed this with her son, who asks that we do not delay ensuring her comfort -Family is planning to come visit in accordance with [facility] policies associated with COVID-19 infection -Her reserve is likely quite low given her age and overall frailty -Comfort care order set utilized -Pain control with morphine drip -Ativan also added for symptom control -Anticipate in-hospital death   DVT prophylaxis: None - comfort measures Code Status: DNR - confirmed with family Family Communication: None present; PCCM spoke with her daughter and I spoke with her son, who both confirm comfort care measures only as the goal for care.  Her daughter requests a video conference with the patient as soon as feasible since she lives in [place] and will be very unlikely to make the trip home prior to her mother's death. Disposition Plan: Anticipate in-hospital death Consults called:  PCCM Admission status: Admit - It is my clinical opinion that admission to INPATIENT is reasonable and necessary because of the expectation that this  patient will require hospital care that crosses at least 2 midnights to treat this condition based on the medical complexity of the problems presented.  Given the aforementioned information, the predictability of an adverse outcome is felt to be significant.     Karmen Bongo MD [facility]   How to contact the [facility] Attending or Consulting provider [place] or covering provider during after hours [place], for this patient?  1. Check the care team in [facility] and look for a) attending/consulting [place] provider listed and  b) the [facility] team listed 2. Log into [email or web address] and use [place]'s universal password to access. If you do not have the password, please contact the hospital operator. 3. Locate the [facility] provider you are looking for under [facility] and page to a number that you can be directly reached. 4. If you still have difficulty reaching the provider, please page the [facility] (Director on Call) for the Hospitalists listed on amion for assistance.   07/24/2019, 5:41 PM

## 2019-07-19 NOTE — ED Notes (Signed)
Pt confused, attempting to remove ECG leads, nonrebreather mask, gown, and bair hugger. Pt O2 dropped to 87% on RA after removal of nonrebreather. Leads and nonrebreather replaced, bair hugger reapplied at a lower setting.

## 2019-07-19 NOTE — Progress Notes (Signed)
Notified provider and bedside nurse of need to draw fluid bolus.  Secure chat sent to MD and RN making aware full fluid volume needed based on weight was 177ml and meets this criteria from the first resulted lactic acid of 7.1

## 2019-07-19 NOTE — Progress Notes (Addendum)
Progress note   Sheri Payne OEU:[id number] DOB: July 30, 1926 DOA: 07/30/2019  PCP: System, Pcp Not In Patient coming from: [facility]  ER Course: Sheri Payne is a 85 y.o. female with medical history significant of mild to moderate dementia who was admitted from 11/11-17 with L hip fracture s/p ORIF.   Now with COVID x 1 week, this AM with AMS and respiratory distress with sats in 50s.  On NRB, now 100%.  CXR not overly impressive.  Would not move left side on presentation but now using her L arm a bit but not L leg.  Hypothermic, tachycardia without hypotension.  Sepsis, ?code stroke.  Lactate was 7 on presentation, given 1L IVF.  +AKI, completed complete bolus.      Past Medical History:  Diagnosis Date  . Glaucoma     Past Surgical History:  Procedure Laterality Date  . APPENDECTOMY    . EYE SURGERY Right    pt unsure what type of eye surgery but states in right eye  . INTRAMEDULLARY (IM) NAIL INTERTROCHANTERIC Left 06/12/2019   Procedure: INTRAMEDULLARY (IM) NAIL INTERTROCHANTRIC;  Surgeon: Leandrew Koyanagi, MD;  Location: [facility];  Service: Orthopedics;  Laterality: Left;    Social History   Socioeconomic History  . Marital status: Widowed    Spouse name: Not on file  . Number of children: Not on file  . Years of education: Not on file  . Highest education level: Not on file  Occupational History  . Not on file  Tobacco Use  . Smoking status: Former Smoker    Types: Cigarettes    Quit date: 02/20/1945    Years since quitting: 74.4  . Smokeless tobacco: Never Used  Substance and Sexual Activity  . Alcohol use: Yes    Comment: 1 martini a day most days  . Drug use: No  . Sexual activity: Not on file  Other Topics Concern  . Not on file  Social History Narrative  . Not on file   Social Determinants of Health   Financial Resource Strain:   . Difficulty of Paying Living Expenses: Not on file  Food Insecurity:   . Worried About Charity fundraiser in the  Last Year: Not on file  . Ran Out of Food in the Last Year: Not on file  Transportation Needs:   . Lack of Transportation (Medical): Not on file  . Lack of Transportation (Non-Medical): Not on file  Physical Activity:   . Days of Exercise per Week: Not on file  . Minutes of Exercise per Session: Not on file  Stress:   . Feeling of Stress : Not on file  Social Connections:   . Frequency of Communication with Friends and Family: Not on file  . Frequency of Social Gatherings with Friends and Family: Not on file  . Attends Religious Services: Not on file  . Active Member of Clubs or Organizations: Not on file  . Attends Archivist Meetings: Not on file  . Marital Status: Not on file  Intimate Partner Violence:   . Fear of Current or Ex-Partner: Not on file  . Emotionally Abused: Not on file  . Physically Abused: Not on file  . Sexually Abused: Not on file    Allergies  Allergen Reactions  . Sulfa Antibiotics Rash    Other reaction(s): Intolerance    No family history on file.  Prior to Admission medications   Medication Sig Start Date End Date Taking? Authorizing Provider  Brinzolamide-Brimonidine (SIMBRINZA) 1-0.2 % SUSP Place 1 drop into the right eye 2 (two) times daily.   Yes [provider]  cholecalciferol (VITAMIN D3) 25 MCG (1000 UT) tablet Take 2,000 Units by mouth daily. For 21 days. Started on 07-15-19.   Yes [provider]  folic acid (FOLVITE) 1 MG tablet Take 1 mg by mouth daily. For 21 days. Started on 07-15-19   Yes [provider]  latanoprost (XALATAN) 0.005 % ophthalmic solution Place 1 drop into both eyes at bedtime.   Yes [provider]  montelukast (SINGULAIR) 10 MG tablet Take 10 mg by mouth at bedtime.   Yes [provider]  Multiple Vitamins-Minerals (CENTRUM SILVER 50+WOMEN PO) Take 1 tablet by mouth daily.   Yes [provider]  Zinc 100 MG TABS Take 100 mg by mouth daily. For 21 days.    Yes [provider]  acetaminophen (TYLENOL) 500 MG tablet Take 500 mg by mouth 3 (three) times daily as needed. For 21 days. Started on 07-15-19    [provider]  albuterol (VENTOLIN HFA) 108 (90 Base) MCG/ACT inhaler Inhale 2 puffs into the lungs 3 (three) times daily as needed for wheezing.    [provider]  apixaban (ELIQUIS) 2.5 MG TABS tablet Take 1 tablet (2.5 mg total) by mouth 2 (two) times daily for 14 days. 06/17/19 07/01/19  Pennie Banter, DO  guaifenesin (ROBITUSSIN) 100 MG/5ML syrup Take 200 mg by mouth 3 (three) times daily as needed for cough.    [provider]  HYDROcodone-acetaminophen (NORCO) 7.5-325 MG tablet Take 1-2 tablets by mouth 3 (three) times daily as needed for moderate pain. 06/12/19   Tarry Kos, MD  polyethylene glycol (MIRALAX / GLYCOLAX) 17 g packet Take 17 g by mouth daily as needed for mild constipation.    [provider]    Physical Exam: Vitals:   2019-07-21 1345 07/21/19 1400 07-21-2019 1415 2019-07-21 1416  BP:      Pulse: (!) 113 (!) 113 (!) 138   Resp: (!) 30 (!) 21 (!) 23   Temp:    (!) 95.7 F (35.4 C)  TempSrc:    Rectal  SpO2: 98% 97% 100%      Radiological Exams on Admission: DG Chest Port 1 View  Result Date: 2019-07-21 CLINICAL DATA:  Shortness of breath, COVID-19 positive EXAM: PORTABLE CHEST 1 VIEW COMPARISON:  06/11/2019 FINDINGS: Heart size is normal. Tortuous thoracic aorta with atherosclerotic calcification. No focal airspace consolidation, pleural effusion, or pneumothorax. Osseous structures are demineralized. Degenerative changes of the spine and shoulders. IMPRESSION: No acute cardiopulmonary findings. Electronically Signed   By: Duanne Guess M.D.   On: Jul 21, 2019 12:28   Labs on Admission: I have personally reviewed the available labs and imaging studies at the time of the admission.  Pertinent labs:   Glucose 288 BUN 52/Creatinine 1.37/GFR 34 LDH 379 Ferritin 858 CRP  12.6 Lactate 7.1, 3.9 Procalcitonin 0.13 WBC 17.5 D-dimer 15.35 Fibrinogen 469 UA: moderate Hgb, 5 ketones, trace LE, 30 protein, many bacteria UDS negative INR 1.2 Blood and urine cultures pending    Based on multisystem organ failure associated with septic shock in a patient who remains full code, I have requested that this patient be evaluated by PCCM for admission.  Admission by [facility] is deferred at this time.   Jonah Blue MD [facility]   How to contact the [facility] Attending or Consulting provider 7A - 7P or covering provider during after hours  7P -7A, for this patient?  1. Check the care team in [facility] and look for a) attending/consulting [facility] provider listed and b) the [facility] team listed 2. Log into [email or web address] and use [place]'s universal password to access. If you do not have the password, please contact the hospital operator. 3. Locate the [facility] provider you are looking for under [facility] and page to a number that you can be directly reached. 4. If you still have difficulty reaching the provider, please page the [facility] (Director on Call) for the Hospitalists listed on amion for assistance.   07/20/2019, 3:02 PM     Addendum at 1708: Patient will be full comfort measures based on PCCM discussion with family.  Her daughter is a pediatric nephrologist in [place].  As such, patient will need to be paged back out for admission at this time.  JEY

## 2019-07-19 NOTE — Consult Note (Signed)
NAME:  Sheri RosebushBarbara Payne, MRN:  [id number], DOB:  06/20/1926, LOS: 0 ADMISSION DATE:  07/09/2019, CONSULTATION DATE:  07/17/2019 REFERRING MD:  Ophelia CharterYates CHIEF COMPLAINT:  Hypoxia due to COVID19 pneumonia   Brief History   85 year old female Covid positive presents with persistent hypoxia, A. fib with RVR and acute/subacute stroke.  PCCM consulted for possible admission.  History of present illness   This is a 85 year old female with surprisingly little past medical history including dementia who is status post left hip fracture ORIF admitted 11/11-11/17 presented from her skilled nursing facility with known Covid positive x1 week, altered mental status with respiratory distress and hypoxia with sats in the 50s.  In the ER she remained hypoxic and placed on nonrebreather with subsequent 100% SPO2.  She would not move her left side on presentation prompting CT of the head which showed acute/subacute frontotemporal stroke.  She was hypothermic and tachycardia without hypotension.  Lactic acid was 7, given 1 L of fluid.  She had a mild AKI on presentation with improved renal function after fluids.  She was also in A. fib with RVR.  PCCM was consulted for possible admission.  Past Medical History   Past Medical History:  Diagnosis Date  . Glaucoma    Past Surgical History:  Procedure Laterality Date  . APPENDECTOMY    . EYE SURGERY Right    pt unsure what type of eye surgery but states in right eye  . INTRAMEDULLARY (IM) NAIL INTERTROCHANTERIC Left 06/12/2019   Procedure: INTRAMEDULLARY (IM) NAIL INTERTROCHANTRIC;  Surgeon: Tarry KosXu, Naiping M, MD;  Location: [facility] OR;  Service: Orthopedics;  Laterality: Left;     Significant Hospital Events     Consults:  PCCM  Procedures:  NA  Significant Diagnostic Tests:  12/19 CT head Acute/subacute infarct involving the right frontotemporal lobe.  Findings consistent with age related atrophy and chronic small vessel ischemia  Prior area of  encephalomalacia involving the right occipital lobe.  12/19 CTA Chest Pending  Micro Data:  SARS Coronavirus 2 POSITIVE    Antimicrobials:  Cefepime Metronidazole Vancomycin  Interim history/subjective:  Patient seen and evaluated in the emergency department.  She is in A. fib with RVR.  LawyerBair hugger in place.  Appears very uncomfortable and frightened.  Objective   Blood pressure 126/82, pulse (!) 137, temperature (!) 95.7 F (35.4 C), temperature source Rectal, resp. rate 16, SpO2 100 %.        Intake/Output Summary (Last 24 hours) at 07/08/2019 1621 Last data filed at 07/07/2019 1558 Gross per 24 hour  Intake 1350 ml  Output -  Net 1350 ml   There were no vitals filed for this visit.  Examination: General: Elderly, cachectic, appears frightened and uncomfortable. HENT: Normocephalic, PERRL.  Dry, tacky mucus membranes Neck: No JVD. Trachea midline.  CV: IRRR. S1S2. No MRG. +1 distal pulses Lungs: BBS diminished at bases, no rhonchi or wheezing, tachypneic, symmetrical ABD: +BS x4.  Tenderness to palpation x4 quadrants, soft, no masses, guarding or rigidity GU: No Foley.  Adult diaper in place EXT: Bilateral lower extremity 1+ edema Skin: Pale, cool, dry, scaly.  Reported stage I sacral wound not visualized Neuro: Alert looking around, tracks when name is called, attempting to speak, incomprehensible, follows commands on right, weakly.  Does not follow commands on left    Resolved Hospital Problem list   NA  Assessment & Plan:  This is a 85 year old female with moderate dementia wtih Covid19 pneumonia with resultant hypoxic respiratory failure, in A.  fib with RVR, with acute/subacute frontotemporal infarct with p possible superimposed bacterial pneumonia. I spoke in depth, first, with the patient's son Sheri Payne who is the POA and requested that I contact his sister Jonel Sick who is a physician in [place] and practices in pediatric nephrology, stating  that she would understand more clearly and can advise definitive goals of care.  I then contacted her and Damyra and I reviewed the above.  Lyndsi states that after her mother was discharged from recent hip surgery that she relayed that she was tired and she has lived a good life and she would not want any heroic or extraordinary measures.  Zakyah states that she wishes to just make her mother comfortable and does not want to escalate care including intubation, mechanical ventilation, central line placement or pressor support or any other invasive measures or active measures and to simply focus on comfort care as opposed to treatment.  Shonika was tearful over the phone but accepting and seemed very comfortable with her decision.  She has requested that once patient is admitted or soon as possible to facilitate a video call with her that she will not be able to be present.  She is to contact her brother.  She states he will likely try to come to the hospital to visit and say his final goodbyes understanding the current pandemic visitation policy which will be relayed to him as well.  In light of this I discussed the case with the hospitalist who is graciously agreed to admit the patient. In the interim I ordered 2 mg of morphine to help with the patient's respiratory distress and overall discomfort.   PCCM will sign off. Thank you for the opportunity to participate in this patient's care. Please contact if we can be of further assistance.]   Labs   CBC: Recent Labs  Lab 14-Aug-2019 1112 Aug 14, 2019 1327  WBC 17.5*  --   NEUTROABS 14.6*  --   HGB 16.8* 15.0  HCT 51.9* 44.0  MCV 96.6  --   PLT 160  --     Basic Metabolic Panel: Recent Labs  Lab 2019/08/14 1112 August 14, 2019 1327  NA 141 143  K 4.6 3.7  CL 105 112*  CO2 15*  --   GLUCOSE 288* 228*  BUN 52* 48*  CREATININE 1.37* 0.90  CALCIUM 9.7  --    GFR: CrCl cannot be calculated (Unknown ideal weight.). Recent Labs  Lab 2019/08/14 1112  2019-08-14 1316  PROCALCITON 0.13  --   WBC 17.5*  --   LATICACIDVEN 7.1* 3.9*    Liver Function Tests: Recent Labs  Lab 08/14/19 1112  AST 40  ALT 19  ALKPHOS 135*  BILITOT 1.3*  PROT 8.5*  ALBUMIN 3.3*   No results for input(s): LIPASE, AMYLASE in the last 168 hours. No results for input(s): AMMONIA in the last 168 hours.  ABG    Component Value Date/Time   TCO2 19 (L) 08-14-2019 1327     Coagulation Profile: Recent Labs  Lab 2019/08/14 1316  INR 1.2    Cardiac Enzymes: No results for input(s): CKTOTAL, CKMB, CKMBINDEX, TROPONINI in the last 168 hours.  HbA1C: No results found for: HGBA1C  CBG: No results for input(s): GLUCAP in the last 168 hours.  Review of Systems:   Unable to obtain due to patient's altered mental status  Past Medical History  She,  has a past medical history of Glaucoma.   Surgical History    Past Surgical History:  Procedure Laterality Date  . APPENDECTOMY    . EYE SURGERY Right    pt unsure what type of eye surgery but states in right eye  . INTRAMEDULLARY (IM) NAIL INTERTROCHANTERIC Left 06/12/2019   Procedure: INTRAMEDULLARY (IM) NAIL INTERTROCHANTRIC;  Surgeon: Tarry Kos, MD;  Location: [facility] OR;  Service: Orthopedics;  Laterality: Left;     Social History   reports that she quit smoking about 74 years ago. Her smoking use included cigarettes. She has never used smokeless tobacco. She reports current alcohol use. She reports that she does not use drugs.   Family History   Her family history is not on file.   Allergies Allergies  Allergen Reactions  . Sulfa Antibiotics Rash    Other reaction(s): Intolerance     Home Medications  Prior to Admission medications   Medication Sig Start Date End Date Taking? Authorizing Provider  Brinzolamide-Brimonidine [facility]) 1-0.2 % SUSP Place 1 drop into the right eye 2 (two) times daily.   Yes [provider]  cholecalciferol (VITAMIN D3) 25 MCG (1000 UT) tablet Take  2,000 Units by mouth daily. For 21 days. Started on 07-15-19.   Yes [provider]  folic acid (FOLVITE) 1 MG tablet Take 1 mg by mouth daily. For 21 days. Started on 07-15-19   Yes [provider]  latanoprost (XALATAN) 0.005 % ophthalmic solution Place 1 drop into both eyes at bedtime.   Yes [provider]  montelukast (SINGULAIR) 10 MG tablet Take 10 mg by mouth at bedtime.   Yes [provider]  Multiple Vitamins-Minerals (CENTRUM SILVER 50+WOMEN PO) Take 1 tablet by mouth daily.   Yes [provider]  Zinc 100 MG TABS Take 100 mg by mouth daily. For 21 days.   Yes [provider]  acetaminophen (TYLENOL) 500 MG tablet Take 500 mg by mouth 3 (three) times daily as needed. For 21 days. Started on 07-15-19    [provider]  albuterol (VENTOLIN HFA) 108 (90 Base) MCG/ACT inhaler Inhale 2 puffs into the lungs 3 (three) times daily as needed for wheezing.    [provider]  apixaban (ELIQUIS) 2.5 MG TABS tablet Take 1 tablet (2.5 mg total) by mouth 2 (two) times daily for 14 days. 06/17/19 07/01/19  Pennie Banter, DO  guaifenesin (ROBITUSSIN) 100 MG/5ML syrup Take 200 mg by mouth 3 (three) times daily as needed for cough.    [provider]  HYDROcodone-acetaminophen (NORCO) 7.5-325 MG tablet Take 1-2 tablets by mouth 3 (three) times daily as needed for moderate pain. 06/12/19   Tarry Kos, MD  polyethylene glycol (MIRALAX / GLYCOLAX) 17 g packet Take 17 g by mouth daily as needed for mild constipation.    [provider]    Karin Lieu, MSN, AGACNP  [facility]

## 2019-07-19 NOTE — Progress Notes (Signed)
Pharmacy Antibiotic Note  Sheri Payne is a 85 y.o. female positive for COVID-19 x 1 week admitted on 07/17/2019 with sepsis.  Pharmacy has been consulted for vancomycin/cefepime dosing. Low temperature, WBC 17.5, LA 7.1. SCr 1.37 on admit (baseline ~0.5-0.9 recently).  Plan: Cefepime 2g IV q24h Vancomycin 1250mg  IV x1; then Vancomycin 1000 mg IV Q 48 hrs. Goal AUC 400-550. Expected AUC: 511 SCr used: 1.37 Flagyl 500mg  IV x 1 per EDP - f/u if to continue Monitor clinical progress, c/s, renal function F/u de-escalation plan/LOT, vancomycin levels as indicated      Temp (24hrs), Avg:90.1 F (32.3 C), Min:90.1 F (32.3 C), Max:90.1 F (32.3 C)  Recent Labs  Lab 07/14/2019 1112  WBC 17.5*  CREATININE 1.37*  LATICACIDVEN 7.1*    CrCl cannot be calculated (Unknown ideal weight.).    Allergies  Allergen Reactions  . Sulfa Antibiotics Rash    Other reaction(s): Intolerance    Antimicrobials this admission: 12/19 vancomycin >>  12/19 cefepime >>  12/19 flagyl x 1  Dose adjustments this admission:   Microbiology results:   Elicia Lamp, PharmD, BCPS Clinical Pharmacist 07/18/2019 12:51 PM

## 2019-07-19 NOTE — ED Triage Notes (Signed)
Pt arrives from nursing facility with hx of covid for 1 week. Pt was found by staff to be altered and having resp distress: on [facility] arrival pt had room air sats in 50%. Ems placed NPA and 15L non re breather pt arrived to ED only responsive to pain will groan has sats 90-93% on 15L. Pt has stage 1 pressure ulcer on sacrum and healing healing wound on thorac spine.

## 2019-07-19 NOTE — ED Notes (Signed)
ED Provider at bedside. 

## 2019-07-20 DIAGNOSIS — R6521 Severe sepsis with septic shock: Secondary | ICD-10-CM

## 2019-07-20 DIAGNOSIS — A419 Sepsis, unspecified organism: Secondary | ICD-10-CM

## 2019-07-20 NOTE — ED Notes (Signed)
Pt non-responsive to verbal or tactile stimuli. Gasping/raspy breathing continues. This RN advised pt that she would not be alone and that she was being monitored and kept comfortable

## 2019-07-20 NOTE — ED Notes (Signed)
Pt is not responsive to verbal or tactile stimuli- gasping, raspy breathing- nonrebreather removed.

## 2019-07-20 NOTE — ED Notes (Signed)
Will continue monitor pt

## 2019-07-20 NOTE — Progress Notes (Signed)
PROGRESS NOTE  Sheri RosebushBarbara Mittal JXB:[id number] DOB: 05/29/1926 DOA: 12-02-2018 PCP: System, Pcp Not In   LOS: 1 day   Brief narrative: Sheri Payne is a 85 y.o. female with medical history significant ofmild to moderate dementia who was admitted from 11/11-17 with Left hip fracture s/p ORIF, now presenting with worsening COVID symptoms.  Patient was in septic shock on presentation with lactate >7 and noted to be full code.     ED Course: Now with COVID x 1 week, this AM with AMS and respiratory distress with sats in 50s. On NRB, now 100%. CXR not overly impressive. Would not move left side on presentation but now using her L arm a bit but not L leg. Hypothermic, tachycardia without hypotension. Sepsis, ?code stroke. Lactate was 7 on presentation, given 1L IVF. +AKI, completed complete bolus.   After discussion with the family the goal of care was comfort care.  Assessment/Plan:  Principal Problem:   Admission for end of life care Active Problems:   CVA (cerebral vascular accident) (HCC)   Septic shock due to undetermined organism (HCC)   Acute respiratory disease due to COVID-19 virus   Dementia without behavioral disturbance (HCC)   End of life care  Septic shock with COVID-19 disease.  On nonrebreather mask.  Focus on comfort care.  Acute/subacute CVA with left hemiparesis.  Plan is comfort care at this time.  History of dementia without behavioral disturbances.  Currently on comfort care.  History of left hip fracture status post ORIF.  VTE Prophylaxis: None for comfort  Code Status: DO NOT RESUSCITATE  Family Communication: None today  Disposition Plan: Inpatient hospitalization for comfort care.  Spoke with nursing staff at bedside.  Continue medications for comfort care.   Consultants:  None  Procedures:  None  Antibiotics: Anti-infectives (From admission, onward)   Start     Dose/Rate Route Frequency Ordered Stop   07/21/19 1400  vancomycin  (VANCOCIN) IVPB 1000 mg/200 mL premix  Status:  Discontinued     1,000 mg 200 mL/hr over 60 Minutes Intravenous Every 48 hours November 23, 2018 1257 November 23, 2018 1738   07/20/19 1330  ceFEPIme (MAXIPIME) 2 g in sodium chloride 0.9 % 100 mL IVPB  Status:  Discontinued     2 g 200 mL/hr over 30 Minutes Intravenous Every 24 hours November 23, 2018 1257 November 23, 2018 1738   November 23, 2018 1300  vancomycin (VANCOREADY) IVPB 1250 mg/250 mL     1,250 mg 166.7 mL/hr over 90 Minutes Intravenous  Once November 23, 2018 1252 November 23, 2018 1558   November 23, 2018 1245  ceFEPIme (MAXIPIME) 2 g in sodium chloride 0.9 % 100 mL IVPB     2 g 200 mL/hr over 30 Minutes Intravenous  Once November 23, 2018 1244 November 23, 2018 1400   November 23, 2018 1245  metroNIDAZOLE (FLAGYL) IVPB 500 mg     500 mg 100 mL/hr over 60 Minutes Intravenous  Once November 23, 2018 1244 November 23, 2018 1427   November 23, 2018 1245  vancomycin (VANCOCIN) IVPB 1000 mg/200 mL premix  Status:  Discontinued     1,000 mg 200 mL/hr over 60 Minutes Intravenous  Once November 23, 2018 1244 November 23, 2018 1252     Subjective: Nursing staff reported that the patient was comfortable on nonrebreather mask.  Objective: Vitals:   07/20/19 0600 07/20/19 0700  BP:    Pulse:    Resp: 15 18  Temp:    SpO2:      Intake/Output Summary (Last 24 hours) at 07/20/2019 [id number] Last data filed at 12-02-2018 1903 Gross per 24 hour  Intake 2350 ml  Output --  Net 2350 ml   There were no vitals filed for this visit. There is no height or weight on file to calculate BMI.   Physical Exam: Deferred due to comfort care and to preserve PPE.  Patient appears to be grossly comfortable on inspection.  Data Review: I have personally reviewed the following laboratory data and studies,  CBC: Recent Labs  Lab 07/12/2019 1112 07/26/2019 1327  WBC 17.5*  --   NEUTROABS 14.6*  --   HGB 16.8* 15.0  HCT 51.9* 44.0  MCV 96.6  --   PLT 160  --    Basic Metabolic Panel: Recent Labs  Lab 07/18/2019 1112 07/30/2019 1327  NA 141 143  K 4.6 3.7  CL 105 112*  CO2 15*   --   GLUCOSE 288* 228*  BUN 52* 48*  CREATININE 1.37* 0.90  CALCIUM 9.7  --    Liver Function Tests: Recent Labs  Lab 07/21/2019 1112  AST 40  ALT 19  ALKPHOS 135*  BILITOT 1.3*  PROT 8.5*  ALBUMIN 3.3*   No results for input(s): LIPASE, AMYLASE in the last 168 hours. No results for input(s): AMMONIA in the last 168 hours. Cardiac Enzymes: No results for input(s): CKTOTAL, CKMB, CKMBINDEX, TROPONINI in the last 168 hours. BNP (last 3 results) Recent Labs    07/15/2019 1139  BNP 119.2*    ProBNP (last 3 results) No results for input(s): PROBNP in the last 8760 hours.  CBG: No results for input(s): GLUCAP in the last 168 hours. Recent Results (from the past 240 hour(s))  SARS CORONAVIRUS 2 (TAT 6-24 HRS) Nasopharyngeal Nasopharyngeal Swab     Status: Abnormal   Collection Time: 07/09/2019 11:39 AM   Specimen: Nasopharyngeal Swab  Result Value Ref Range Status   SARS Coronavirus 2 POSITIVE (A) NEGATIVE Final    Comment: RESULT CALLED TO, READ BACK BY AND VERIFIED WITH: RYLAND KENNEDY RN.@1555  ON 12.19.2020 BY TCALDWELL MT. (NOTE) SARS-CoV-2 target nucleic acids are DETECTED. The SARS-CoV-2 RNA is generally detectable in upper and lower respiratory specimens during the acute phase of infection. Positive results are indicative of the presence of SARS-CoV-2 RNA. Clinical correlation with patient history and other diagnostic information is  necessary to determine patient infection status. Positive results do not rule out bacterial infection or co-infection with other viruses.  The expected result is Negative. Fact Sheet for Patients: HairSlick.no Fact Sheet for Healthcare Providers: quierodirigir.com This test is not yet approved or cleared by the [place] FDA and  has been authorized for detection and/or diagnosis of SARS-CoV-2 by FDA under an Emergency Use Authorization (EUA). This EUA will remain  in effect  (meaning this test c an be used) for the duration of the COVID-19 declaration under Section 564(b)(1) of the Act, 21 U.S.C. section 360bbb-3(b)(1), unless the authorization is terminated or revoked sooner. Performed at [facility], [address] [place], [place] [address]      Studies: CT Head Wo Contrast  Result Date: 07/10/2019 CLINICAL DATA:  Neuro deficit question of acute stroke EXAM: CT HEAD WITHOUT CONTRAST TECHNIQUE: Contiguous axial images were obtained from the base of the skull through the vertex without intravenous contrast. COMPARISON:  February 20, 2017 FINDINGS: Brain: No evidence of acute territorial infarction, hemorrhage, hydrocephalus,extra-axial collection or mass lesion/mass effect. There is dilatation the ventricles and sulci consistent with age-related atrophy. Low-attenuation changes in the deep white matter consistent with small vessel ischemia. There is area of encephalomalacia involving the right occipital lobe as on  prior exam. There is area of hypodensity involving the anterior right frontotemporal lobe which is new since the prior exam. Vascular: No hyperdense vessel or unexpected calcification. Skull: The skull is intact. No fracture or focal lesion identified. Sinuses/Orbits: The visualized paranasal sinuses and mastoid air cells are clear. The orbits and globes intact. Other: None IMPRESSION: Acute/subacute infarct involving the right frontotemporal lobe. Findings consistent with age related atrophy and chronic small vessel ischemia Prior area of encephalomalacia involving the right occipital lobe. These results were called by telephone at the time of interpretation on 07/08/2019 at 3:17 pm to provider Vanetta Mulders , who verbally acknowledged these results. Electronically Signed   By: Jonna Clark M.D.   On: 07/06/2019 15:21   CT Angio Chest PE W/Cm &/Or Wo Cm  Result Date: 07/31/2019 CLINICAL DATA:  Shortness of breath. Recently diagnosed with COVID-19  infection. EXAM: CT ANGIOGRAPHY CHEST WITH CONTRAST TECHNIQUE: Multidetector CT imaging of the chest was performed using the standard protocol during bolus administration of intravenous contrast. Multiplanar CT image reconstructions and MIPs were obtained to evaluate the vascular anatomy. CONTRAST:  60mL OMNIPAQUE IOHEXOL 350 MG/ML SOLN COMPARISON:  Radiographs today and 06/11/2019. FINDINGS: Cardiovascular: The pulmonary arteries are well opacified with contrast to the level of the subsegmental branches. There is no evidence of acute pulmonary embolism. There is limited opacification of the systemic arteries. Mild aortic atherosclerosis noted. The heart size is normal. There is no pericardial effusion. Mediastinum/Nodes: There are no enlarged mediastinal, hilar or axillary lymph nodes.There are calcified subcarinal and hilar lymph nodes bilaterally. The thyroid gland, trachea and esophagus demonstrate no significant findings. Lungs/Pleura: No pleural effusion or pneumothorax. There is a calcified granuloma at the left lung base. Nonspecific dependent airspace opacities are present at both lung bases, right greater than left. In addition, there are scattered peripheral nodular ground-glass opacities in both lungs, typical of COVID-19 infection. Upper abdomen: There is a large fatty mass in the left upper retroperitoneum, appearing to arise from the left adrenal gland. This measures 7.3 x 5.9 cm on image 86/5, likely a left adrenal myelolipoma. There are nonobstructing bilateral renal calculi. Multiple small calcified granulomas are present in the liver and spleen. Musculoskeletal/Chest wall: Multiple age-indeterminate compression deformities in the thoracolumbar spine. Fractures at T12 and L1 are grossly unchanged from lumbar spine radiographs 02/20/2017. A T10 fracture has progressed since the toes radiographs there are mild compression deformities at T1 and T2 as well. Multiple left-sided rib fractures. Review of  the MIP images confirms the above findings. IMPRESSION: 1. No evidence of acute pulmonary embolism or other acute vascular process. 2. Patchy nodular ground-glass opacities in both lungs typical of COVID-19 infection. Additional nonspecific opacities at both lung bases. 3. Sequela of prior granulomatous disease with calcified mediastinal and hilar lymph nodes, splenic and hepatic granulomas. 4. Large left adrenal myelolipoma. 5. Multiple age-indeterminate compression deformities in the thoracolumbar spine. The T10 fracture has progressed since the lumbar spine radiographs 02/20/2017. Multiple old left-sided rib fractures. Electronically Signed   By: Carey Bullocks M.D.   On: 07/21/2019 17:29   DG Chest Port 1 View  Result Date: 07/16/2019 CLINICAL DATA:  Shortness of breath, COVID-19 positive EXAM: PORTABLE CHEST 1 VIEW COMPARISON:  06/11/2019 FINDINGS: Heart size is normal. Tortuous thoracic aorta with atherosclerotic calcification. No focal airspace consolidation, pleural effusion, or pneumothorax. Osseous structures are demineralized. Degenerative changes of the spine and shoulders. IMPRESSION: No acute cardiopulmonary findings. Electronically Signed   By: Duanne Guess M.D.   On:  Jul 23, 2019 12:28    Scheduled Meds:  Continuous Infusions:  morphine 5 mg/hr (July 23, 2019 1913)     Flora Lipps, MD  [facility] 07/20/2019

## 2019-07-20 NOTE — ED Notes (Signed)
Hospital bed ordered for patient.

## 2019-07-20 NOTE — ED Notes (Signed)
Will continue to monitor pt

## 2019-07-21 LAB — URINE CULTURE: Culture: >=100000 — AB

## 2019-07-21 MED ORDER — CHLORHEXIDINE GLUCONATE 0.12 % MT SOLN
15.0000 mL | Freq: Two times a day (BID) | OROMUCOSAL | Status: DC
  Administered 2019-07-21 (×2): 15 mL via OROMUCOSAL
  Filled 2019-07-21: qty 15

## 2019-07-21 MED ORDER — ORAL CARE MOUTH RINSE
15.0000 mL | Freq: Two times a day (BID) | OROMUCOSAL | Status: DC
  Administered 2019-07-21 (×2): 15 mL via OROMUCOSAL

## 2019-07-21 NOTE — Progress Notes (Signed)
PROGRESS NOTE  Sheri Payne ZOX:[id number] DOB: January 02, 1926 DOA: August 06, 2019 PCP: System, Pcp Not In   LOS: 2 days   Brief narrative: Sheri Payne is a 85 y.o. female with medical history significant ofmild to moderate dementia who was admitted from 11/11-17 with Left hip fracture s/p ORIF, now presenting with worsening COVID symptoms.  Patient was in septic shock on presentation with lactate >7 and noted to be full code.     ED Course: Now with COVID x 1 week, this AM with AMS and respiratory distress with sats in 50s. On NRB, now 100%. CXR not overly impressive. Would not move left side on presentation but now using her L arm a bit but not L leg. Hypothermic, tachycardia without hypotension. Sepsis, ?code stroke. Lactate was 7 on presentation, given 1L IVF. +AKI, completed complete bolus.   After discussion with the family the goal of care was comfort care.  Assessment/Plan:  Principal Problem:   Admission for end of life care Active Problems:   CVA (cerebral vascular accident) (HCC)   Septic shock due to undetermined organism (HCC)   Acute respiratory disease due to COVID-19 virus   Dementia without behavioral disturbance (HCC)   End of life care  Septic shock with COVID-19 disease.    Focus on comfort care.  Currently gasping on nonrebreather mask.  Acute hypoxic respiratory failure likely secondary to Covid pneumonia on nonrebreather mask.  Gasping at this time appears comfortable.  Acute/subacute CVA with left hemiparesis.  Plan is comfort care at this time.  History of dementia without behavioral disturbances.  Currently on comfort care.  History of left hip fracture status post ORIF.  Focus on comfort  VTE Prophylaxis: None for comfort  Code Status: DO NOT RESUSCITATE  Family Communication: Spoke with the patient's son Casimiro Needle on the phone and updated him about the patient.  Disposition Plan:  comfort care.      Consultants:  None  Procedures:  None  Antibiotics: Anti-infectives (From admission, onward)   Start     Dose/Rate Route Frequency Ordered Stop   07/21/19 1400  vancomycin (VANCOCIN) IVPB 1000 mg/200 mL premix  Status:  Discontinued     1,000 mg 200 mL/hr over 60 Minutes Intravenous Every 48 hours 2019/08/06 1257 08-06-19 1738   07/20/19 1330  ceFEPIme (MAXIPIME) 2 g in sodium chloride 0.9 % 100 mL IVPB  Status:  Discontinued     2 g 200 mL/hr over 30 Minutes Intravenous Every 24 hours 06-Aug-2019 1257 2019-08-06 1738   06-Aug-2019 1300  vancomycin (VANCOREADY) IVPB 1250 mg/250 mL     1,250 mg 166.7 mL/hr over 90 Minutes Intravenous  Once 08-06-19 1252 August 06, 2019 1558   08/06/2019 1245  ceFEPIme (MAXIPIME) 2 g in sodium chloride 0.9 % 100 mL IVPB     2 g 200 mL/hr over 30 Minutes Intravenous  Once 06-Aug-2019 1244 2019/08/06 1400   2019/08/06 1245  metroNIDAZOLE (FLAGYL) IVPB 500 mg     500 mg 100 mL/hr over 60 Minutes Intravenous  Once 08/06/19 1244 06-Aug-2019 1427   2019-08-06 1245  vancomycin (VANCOCIN) IVPB 1000 mg/200 mL premix  Status:  Discontinued     1,000 mg 200 mL/hr over 60 Minutes Intravenous  Once August 06, 2019 1244 08-06-2019 1252     Subjective: Today, seen at bedside.  Patient is unresponsive.  On nonrebreather mask gasping.  Objective: Vitals:   07/21/19 0400 07/21/19 0600  BP:    Pulse: 100 (!) 101  Resp:  (!) 7  Temp:  SpO2: 98% 94%    Intake/Output Summary (Last 24 hours) at 07/21/2019 0744 Last data filed at 07/21/2019 0700 Gross per 24 hour  Intake 44.99 ml  Output --  Net 44.99 ml   Filed Weights   07/20/19 2245  Weight: 57.7 kg   Body mass index is 21.83 kg/m.   Physical Exam: Limited exam was done.  Patient is a thinly built on nonrebreather mask gasping.  Unresponsive.  Data Review: I have personally reviewed the following laboratory data and studies,  CBC: Recent Labs  Lab 08-11-2019 1112 August 11, 2019 1327  WBC 17.5*  --   NEUTROABS 14.6*  --   HGB  16.8* 15.0  HCT 51.9* 44.0  MCV 96.6  --   PLT 160  --    Basic Metabolic Panel: Recent Labs  Lab 08/11/19 1112 08/11/2019 1327  NA 141 143  K 4.6 3.7  CL 105 112*  CO2 15*  --   GLUCOSE 288* 228*  BUN 52* 48*  CREATININE 1.37* 0.90  CALCIUM 9.7  --    Liver Function Tests: Recent Labs  Lab Aug 11, 2019 1112  AST 40  ALT 19  ALKPHOS 135*  BILITOT 1.3*  PROT 8.5*  ALBUMIN 3.3*   No results for input(s): LIPASE, AMYLASE in the last 168 hours. No results for input(s): AMMONIA in the last 168 hours. Cardiac Enzymes: No results for input(s): CKTOTAL, CKMB, CKMBINDEX, TROPONINI in the last 168 hours. BNP (last 3 results) Recent Labs    August 11, 2019 1139  BNP 119.2*    ProBNP (last 3 results) No results for input(s): PROBNP in the last 8760 hours.  CBG: No results for input(s): GLUCAP in the last 168 hours. Recent Results (from the past 240 hour(s))  Blood Culture (routine x 2)     Status: None (Preliminary result)   Collection Time: August 11, 2019 11:12 AM   Specimen: BLOOD  Result Value Ref Range Status   Specimen Description BLOOD SITE NOT SPECIFIED  Final   Special Requests   Final    BOTTLES DRAWN AEROBIC AND ANAEROBIC Blood Culture results may not be optimal due to an inadequate volume of blood received in culture bottles   Culture   Final    NO GROWTH 2 DAYS Performed at [facility], [place] [address] [place], [place] [address]    Report Status PENDING  Incomplete  Urine culture     Status: Abnormal (Preliminary result)   Collection Time: Aug 11, 2019 11:27 AM   Specimen: In/Out Cath Urine  Result Value Ref Range Status   Specimen Description IN/OUT CATH URINE  Final   Special Requests NONE  Final   Culture (A)  Final    >=100,000 COLONIES/mL ESCHERICHIA COLI SUSCEPTIBILITIES TO FOLLOW CULTURE REINCUBATED FOR BETTER GROWTH Performed at [facility], [place] [address], [place], [place] [address]    Report Status PENDING  Incomplete  SARS CORONAVIRUS 2 (TAT  6-24 HRS) Nasopharyngeal Nasopharyngeal Swab     Status: Abnormal   Collection Time: 2019/08/11 11:39 AM   Specimen: Nasopharyngeal Swab  Result Value Ref Range Status   SARS Coronavirus 2 POSITIVE (A) NEGATIVE Final    Comment: RESULT CALLED TO, READ BACK BY AND VERIFIED WITH: RYLAND KENNEDY RN.@1555  ON 11-Aug-2019 BY TCALDWELL MT. (NOTE) SARS-CoV-2 target nucleic acids are DETECTED. The SARS-CoV-2 RNA is generally detectable in upper and lower respiratory specimens during the acute phase of infection. Positive results are indicative of the presence of SARS-CoV-2 RNA. Clinical correlation with patient history and other  diagnostic information is  necessary to determine patient infection status. Positive results do not rule out bacterial infection or co-infection with other viruses.  The expected result is Negative. Fact Sheet for Patients: HairSlick.no Fact Sheet for Healthcare Providers: quierodirigir.com This test is not yet approved or cleared by the [place] FDA and  has been authorized for detection and/or diagnosis of SARS-CoV-2 by FDA under an Emergency Use Authorization (EUA). This EUA will remain  in effect (meaning this test c an be used) for the duration of the COVID-19 declaration under Section 564(b)(1) of the Act, 21 U.S.C. section 360bbb-3(b)(1), unless the authorization is terminated or revoked sooner. Performed at [facility], [address], [place], [place] [address]   Blood Culture (routine x 2)     Status: None (Preliminary result)   Collection Time: 07/27/2019 12:05 PM   Specimen: BLOOD RIGHT ARM  Result Value Ref Range Status   Specimen Description BLOOD RIGHT ARM  Final   Special Requests   Final    BOTTLES DRAWN AEROBIC ONLY Blood Culture adequate volume   Culture   Final    NO GROWTH 2 DAYS Performed at [facility], [address], [place], [place] [address]    Report Status PENDING   Incomplete     Studies: CT Head Wo Contrast  Result Date: 07/06/2019 CLINICAL DATA:  Neuro deficit question of acute stroke EXAM: CT HEAD WITHOUT CONTRAST TECHNIQUE: Contiguous axial images were obtained from the base of the skull through the vertex without intravenous contrast. COMPARISON:  February 20, 2017 FINDINGS: Brain: No evidence of acute territorial infarction, hemorrhage, hydrocephalus,extra-axial collection or mass lesion/mass effect. There is dilatation the ventricles and sulci consistent with age-related atrophy. Low-attenuation changes in the deep white matter consistent with small vessel ischemia. There is area of encephalomalacia involving the right occipital lobe as on prior exam. There is area of hypodensity involving the anterior right frontotemporal lobe which is new since the prior exam. Vascular: No hyperdense vessel or unexpected calcification. Skull: The skull is intact. No fracture or focal lesion identified. Sinuses/Orbits: The visualized paranasal sinuses and mastoid air cells are clear. The orbits and globes intact. Other: None IMPRESSION: Acute/subacute infarct involving the right frontotemporal lobe. Findings consistent with age related atrophy and chronic small vessel ischemia Prior area of encephalomalacia involving the right occipital lobe. These results were called by telephone at the time of interpretation on 07/09/2019 at 3:17 pm to provider Vanetta Mulders , who verbally acknowledged these results. Electronically Signed   By: Jonna Clark M.D.   On: 07/07/2019 15:21   CT Angio Chest PE W/Cm &/Or Wo Cm  Result Date: 07/05/2019 CLINICAL DATA:  Shortness of breath. Recently diagnosed with COVID-19 infection. EXAM: CT ANGIOGRAPHY CHEST WITH CONTRAST TECHNIQUE: Multidetector CT imaging of the chest was performed using the standard protocol during bolus administration of intravenous contrast. Multiplanar CT image reconstructions and MIPs were obtained to evaluate the vascular  anatomy. CONTRAST:  67mL OMNIPAQUE IOHEXOL 350 MG/ML SOLN COMPARISON:  Radiographs today and 06/11/2019. FINDINGS: Cardiovascular: The pulmonary arteries are well opacified with contrast to the level of the subsegmental branches. There is no evidence of acute pulmonary embolism. There is limited opacification of the systemic arteries. Mild aortic atherosclerosis noted. The heart size is normal. There is no pericardial effusion. Mediastinum/Nodes: There are no enlarged mediastinal, hilar or axillary lymph nodes.There are calcified subcarinal and hilar lymph nodes bilaterally. The thyroid gland, trachea and esophagus demonstrate no significant findings. Lungs/Pleura: No  pleural effusion or pneumothorax. There is a calcified granuloma at the left lung base. Nonspecific dependent airspace opacities are present at both lung bases, right greater than left. In addition, there are scattered peripheral nodular ground-glass opacities in both lungs, typical of COVID-19 infection. Upper abdomen: There is a large fatty mass in the left upper retroperitoneum, appearing to arise from the left adrenal gland. This measures 7.3 x 5.9 cm on image 86/5, likely a left adrenal myelolipoma. There are nonobstructing bilateral renal calculi. Multiple small calcified granulomas are present in the liver and spleen. Musculoskeletal/Chest wall: Multiple age-indeterminate compression deformities in the thoracolumbar spine. Fractures at T12 and L1 are grossly unchanged from lumbar spine radiographs 02/20/2017. A T10 fracture has progressed since the toes radiographs there are mild compression deformities at T1 and T2 as well. Multiple left-sided rib fractures. Review of the MIP images confirms the above findings. IMPRESSION: 1. No evidence of acute pulmonary embolism or other acute vascular process. 2. Patchy nodular ground-glass opacities in both lungs typical of COVID-19 infection. Additional nonspecific opacities at both lung bases. 3. Sequela  of prior granulomatous disease with calcified mediastinal and hilar lymph nodes, splenic and hepatic granulomas. 4. Large left adrenal myelolipoma. 5. Multiple age-indeterminate compression deformities in the thoracolumbar spine. The T10 fracture has progressed since the lumbar spine radiographs 02/20/2017. Multiple old left-sided rib fractures. Electronically Signed   By: Carey BullocksWilliam  Veazey M.D.   On: 07/03/2019 17:29   DG Chest Port 1 View  Result Date: 07/10/2019 CLINICAL DATA:  Shortness of breath, COVID-19 positive EXAM: PORTABLE CHEST 1 VIEW COMPARISON:  06/11/2019 FINDINGS: Heart size is normal. Tortuous thoracic aorta with atherosclerotic calcification. No focal airspace consolidation, pleural effusion, or pneumothorax. Osseous structures are demineralized. Degenerative changes of the spine and shoulders. IMPRESSION: No acute cardiopulmonary findings. Electronically Signed   By: Duanne GuessNicholas  Plundo M.D.   On: 07/28/2019 12:28    Scheduled Meds: . chlorhexidine  15 mL Mouth Rinse BID  . mouth rinse  15 mL Mouth Rinse q12n4p    Continuous Infusions: . morphine 5 mg/hr (07/21/19 0700)     Joycelyn DasLaxman Ellyssa Zagal, MD  [facility] 07/21/2019

## 2019-07-24 LAB — CULTURE, BLOOD (ROUTINE X 2)
Culture: NO GROWTH
Culture: NO GROWTH
Special Requests: ADEQUATE

## 2019-07-29 ENCOUNTER — Ambulatory Visit: Payer: Self-pay | Admitting: Physician Assistant

## 2019-08-01 NOTE — Discharge Summary (Signed)
Death Summary  Sheri Payne JJO:[id number] DOB: 1926-06-20 DOA: July 29, 2019  PCP: System, Pcp Not In PCP/Office notified: n/a  Admit date: 07-29-19 Date of Death: August 01, 2019  Final Diagnoses:  Principal Problem:   Admission for end of life care Active Problems:   CVA (cerebral vascular accident) ([place])   Septic shock due to undetermined organism ([place])   Acute respiratory disease due to COVID-19 virus   Dementia without behavioral disturbance ([place])   End of life care   History of present illness:  Sheri Botelhois a 86 y.o.femalewith medical history significant ofmild to moderate dementia who was admitted from 11/11-17 with Left hip fracture s/p ORIF, now presenting with worsening COVID symptoms. Patient was in septic shock on presentation with lactate >7.   ED Course: patient had COVID x 1 week, presented with AMS and respiratory distress with sats in 50s. On NRB in ED100%. CXR not overly impressive. Would not move left side on presentation but now using her L arm a bit but not L leg. Hypothermic, tachycardia without hypotension. Sepsis, andLactate was 7 on presentation, Patient received1L IVF. Had acute kidney injury.    After discussion with the family, the goal of care was comfort care.  Hospital Course:  Following conditions were addressed during hospitalization,   Septic shock with COVID-19 disease.   After discussion with the family plan was to focus on  comfort care.    Acute hypoxic respiratory failure likely secondary to Covid pneumonia.  Patient was kept on on nonrebreather mask for comfort..  Acute/subacute CVA with left hemiparesis.    No further work-up was done.  Patient was continued on comfort care.  History of dementia without behavioral disturbances.  Comfort care was pursued  History of left hip fracture status post ORIF.  Focus on comfort   Time: 1601, Death pronounced by RN  Signed:  Ishika Chesterfield  [facility] 08/01/19,  11:59 AM

## 2019-08-01 NOTE — Progress Notes (Signed)
Pt expired at 0446. Pronounced by 2 RNs. Pt was a DNR and comfort care only. Death certificate completed and given to Sonia Baller, Therapist, sports.  KJKG, NP [facility]

## 2019-08-01 NOTE — Progress Notes (Signed)
Pt was pronounced at 446 by RN. NP, Son, as well as [place] [facility], and Pt Placement notified. Postmortem check list completed.

## 2019-08-01 DEATH — deceased

## 2020-05-19 IMAGING — CT CT HEAD W/O CM
3 of 4 series · 15 of 47 positions shown, 18 images · non-contrast
Comparison: February 20, 2017

CLINICAL DATA: Neuro deficit question of acute stroke

EXAM:
CT HEAD WITHOUT CONTRAST
TECHNIQUE: Contiguous axial images were obtained from the base of the skull
through the vertex without intravenous contrast.

[Series 3: head 5.0 h30s · axial · 0.42mm/px · z∈[-98,+37]mm · 9 of 33 slices shown, 12 images]
[im 3/33  brain]
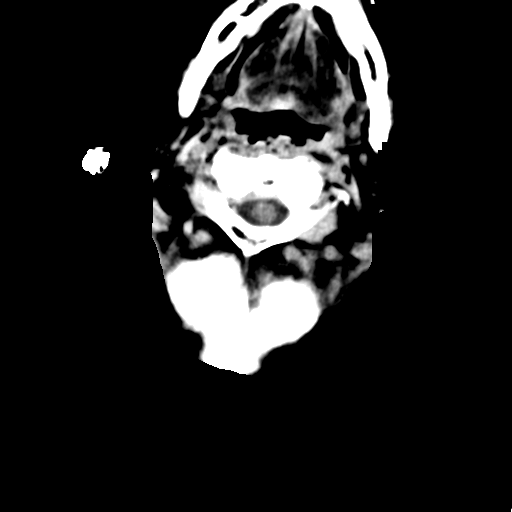
[im 3/33  bone]
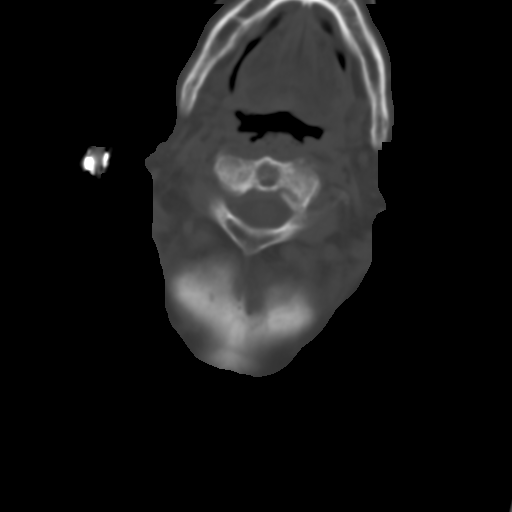
[im 7/33  brain]
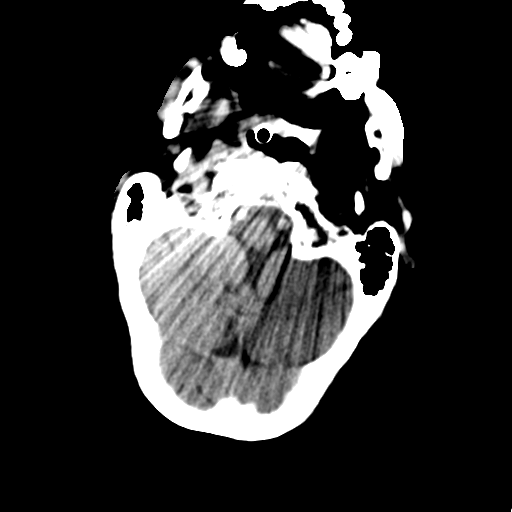
[im 10/33  brain]
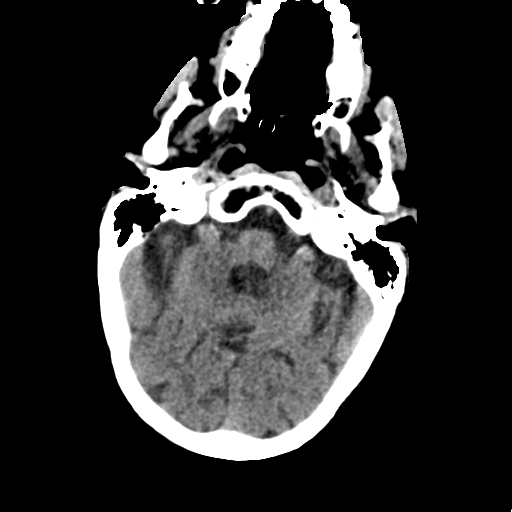
[im 14/33  brain]
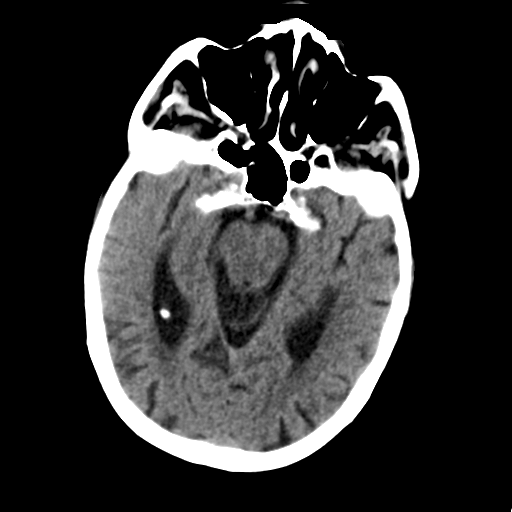
[im 17/33  brain]
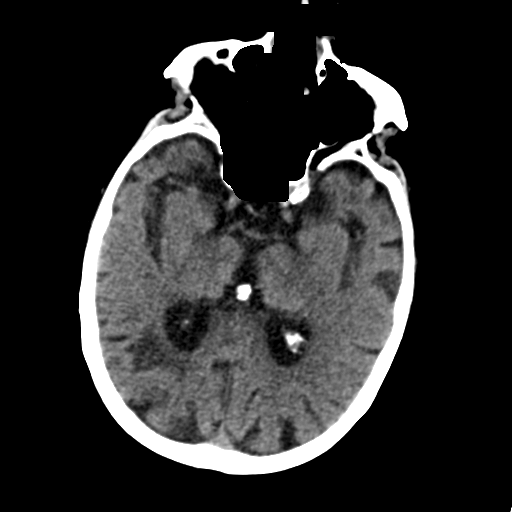
[im 17/33  bone]
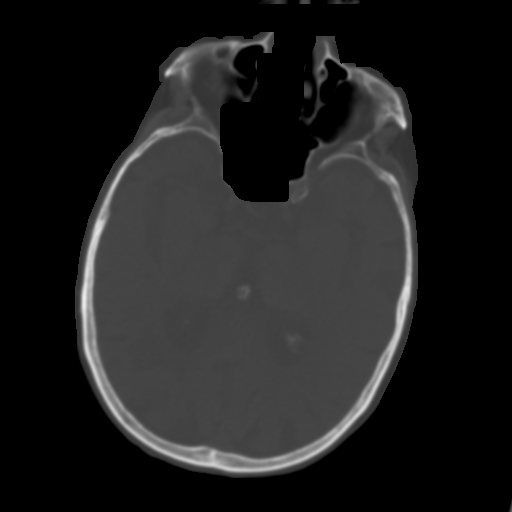
[im 19/33  brain]
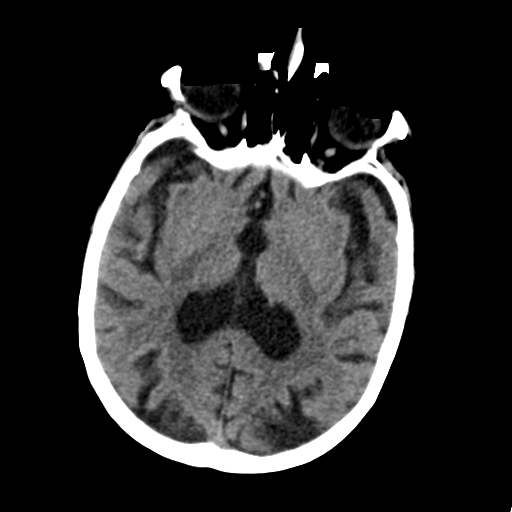
[im 23/33  brain]
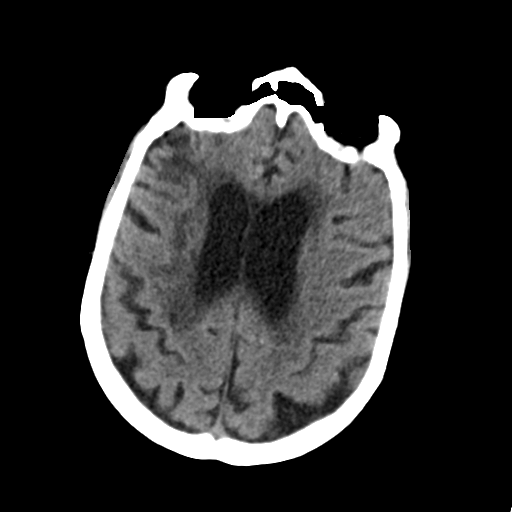
[im 26/33  brain]
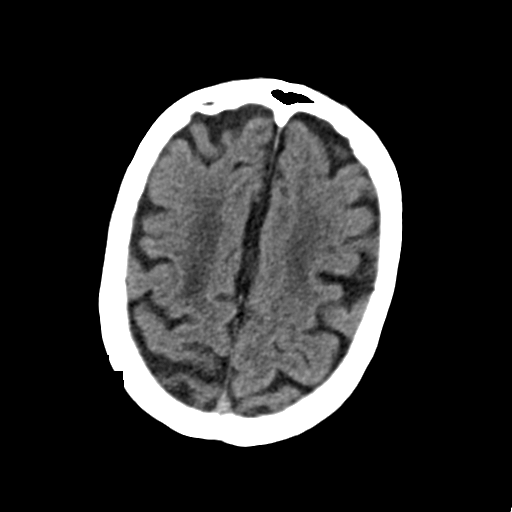
[im 30/33  brain]
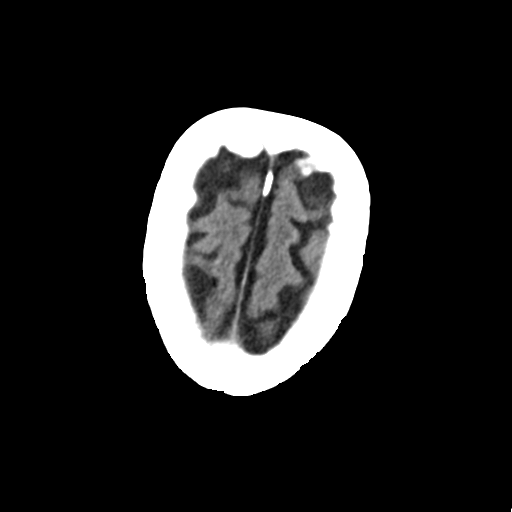
[im 30/33  bone]
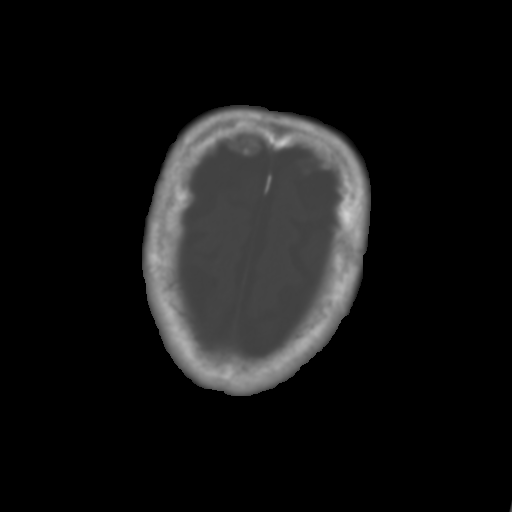

[Series 6: head 3.0 mpr cor · coronal · 0.36mm/px · 3 of 81 slices shown]
[im 27/81  brain]
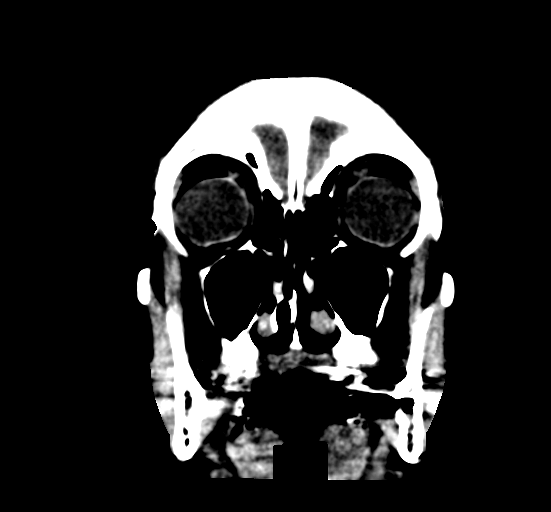
[im 36/81  brain]
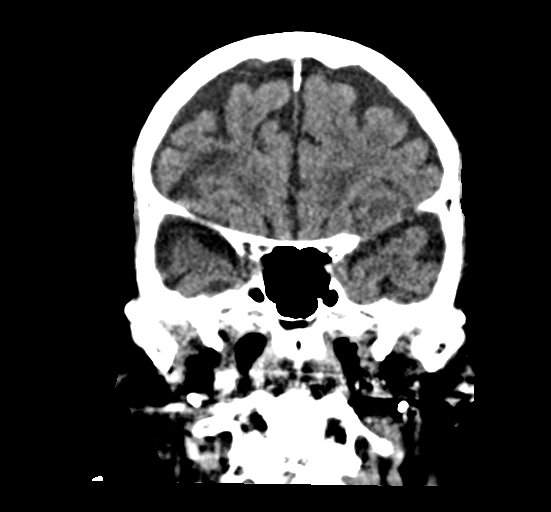
[im 45/81  brain]
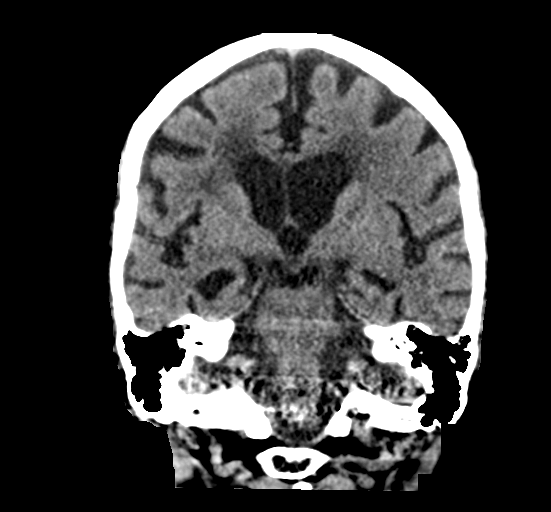

[Series 7: head 3.0 mpr sag · sagittal · 0.35mm/px · 3 of 49 slices shown]
[im 17/49  brain]
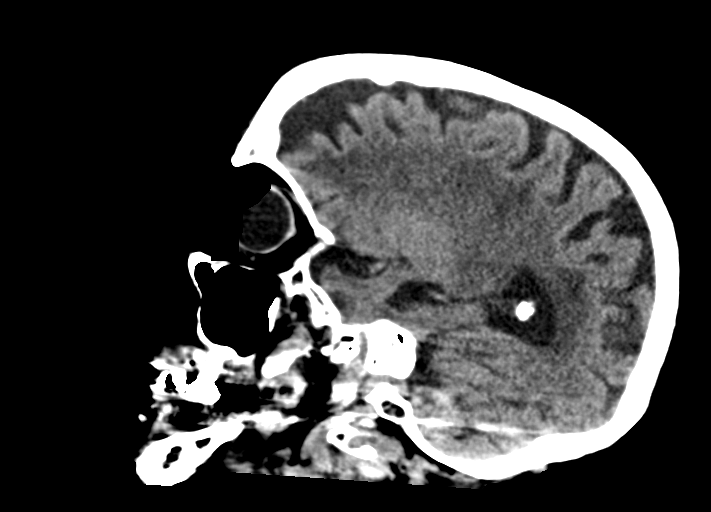
[im 25/49  brain]
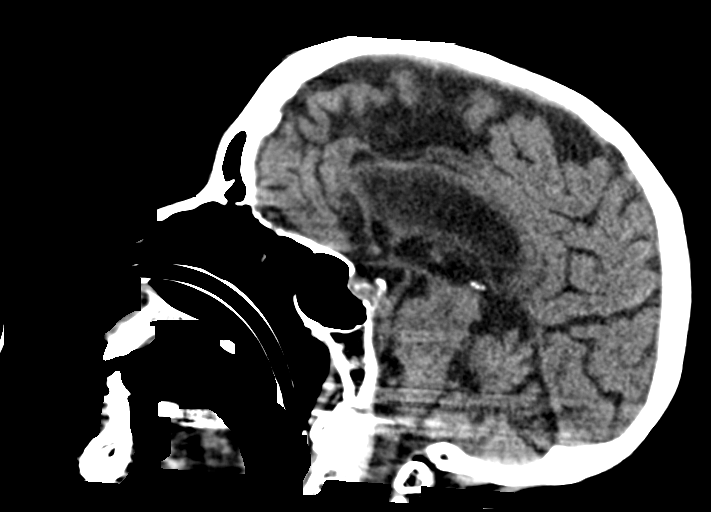
[im 33/49  brain]
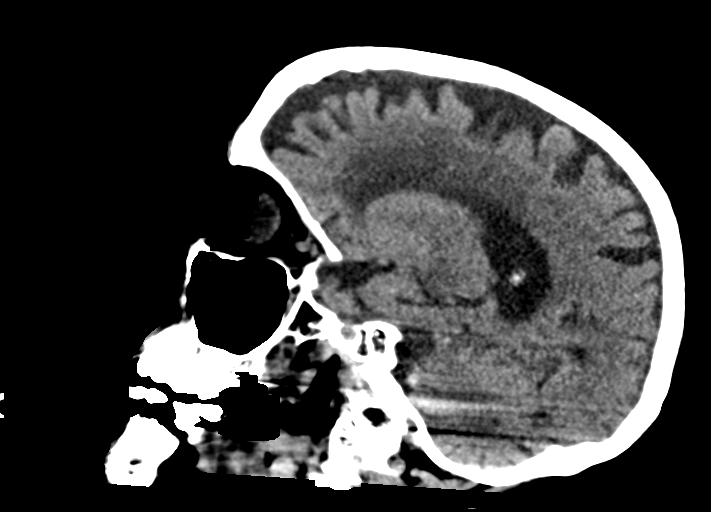

[15 of 47 positions shown; findings below may reference images not displayed]

FINDINGS: Brain: No evidence of acute territorial infarction, hemorrhage,
hydrocephalus,extra-axial collection or mass lesion/mass effect.
There is dilatation the ventricles and sulci consistent with
age-related atrophy. Low-attenuation changes in the deep white
matter consistent with small vessel ischemia. There is area of
encephalomalacia involving the right occipital lobe as on prior
exam. There is area of hypodensity involving the anterior right
frontotemporal lobe which is new since the prior exam.

Vascular: No hyperdense vessel or unexpected calcification.

Skull: The skull is intact. No fracture or focal lesion identified.

Sinuses/Orbits: The visualized paranasal sinuses and mastoid air
cells are clear. The orbits and globes intact.

Other: None
IMPRESSION: Acute/subacute infarct involving the right frontotemporal lobe.

Findings consistent with age related atrophy and chronic small
vessel ischemia

Prior area of encephalomalacia involving the right occipital lobe.

These results were called by telephone at the time of interpretation
on 07/19/2019 at [DATE] to provider HUI CLEVENGER , who verbally
acknowledged these results.
